# Patient Record
Sex: Male | Born: 1985 | Race: White | Hispanic: No | Marital: Married | Smoking: Current every day smoker
Health system: Southern US, Community
[De-identification: ages and names within clinical notes are randomized; demographics above are authoritative.]

## PROBLEM LIST (undated history)

## (undated) DIAGNOSIS — M791 Myalgia, unspecified site: Secondary | ICD-10-CM

## (undated) HISTORY — DX: Myalgia, unspecified site: M79.10

---

## 2016-10-21 HISTORY — PX: HERNIA REPAIR: SHX51

## 2017-11-07 ENCOUNTER — Other Ambulatory Visit: Payer: Self-pay | Admitting: Surgery

## 2017-11-07 DIAGNOSIS — Z8719 Personal history of other diseases of the digestive system: Secondary | ICD-10-CM

## 2017-11-07 DIAGNOSIS — Z9889 Other specified postprocedural states: Principal | ICD-10-CM

## 2017-11-13 ENCOUNTER — Ambulatory Visit
Admission: RE | Admit: 2017-11-13 | Discharge: 2017-11-13 | Disposition: A | Discharge status: Home / Self Care | Payer: No Typology Code available for payment source | Source: Ambulatory Visit | Attending: Surgery | Admitting: Surgery

## 2017-11-13 DIAGNOSIS — Z8719 Personal history of other diseases of the digestive system: Secondary | ICD-10-CM

## 2017-11-13 DIAGNOSIS — Z9889 Other specified postprocedural states: Principal | ICD-10-CM

## 2017-11-13 MED ORDER — IOPAMIDOL (ISOVUE-300) INJECTION 61%
100.0000 mL | Freq: Once | INTRAVENOUS | Status: AC | PRN
  Administered 2017-11-13: 100 mL via INTRAVENOUS

## 2018-03-05 ENCOUNTER — Other Ambulatory Visit: Payer: Self-pay | Admitting: Nurse Practitioner

## 2018-03-05 DIAGNOSIS — M545 Low back pain: Secondary | ICD-10-CM

## 2018-03-12 ENCOUNTER — Other Ambulatory Visit: Payer: No Typology Code available for payment source

## 2018-03-15 ENCOUNTER — Other Ambulatory Visit: Payer: No Typology Code available for payment source

## 2018-03-18 ENCOUNTER — Ambulatory Visit
Admission: RE | Admit: 2018-03-18 | Discharge: 2018-03-18 | Disposition: A | Discharge status: Home / Self Care | Payer: No Typology Code available for payment source | Source: Ambulatory Visit | Attending: Nurse Practitioner | Admitting: Nurse Practitioner

## 2018-03-18 DIAGNOSIS — M545 Low back pain: Secondary | ICD-10-CM

## 2018-05-11 ENCOUNTER — Ambulatory Visit: Payer: BLUE CROSS/BLUE SHIELD | Admitting: Family Medicine

## 2018-05-11 ENCOUNTER — Other Ambulatory Visit: Payer: Self-pay

## 2018-05-11 ENCOUNTER — Encounter: Payer: Self-pay | Admitting: Family Medicine

## 2018-05-11 VITALS — BP 122/88 | HR 62 | Temp 98.6°F | Ht 75.0 in | Wt 234.6 lb

## 2018-05-11 DIAGNOSIS — G894 Chronic pain syndrome: Secondary | ICD-10-CM | POA: Diagnosis not present

## 2018-05-11 NOTE — Assessment & Plan Note (Signed)
Unclear etiology of chronic pain Given distribution of pain however I don't think that pain is related to hernia repair and timing is coincidental.  ?Inflammatory component given response to prednisone.  ?IBD with GI symptoms as well Records requested from previous PCP to see what lab work up has been completed thus far. If not done already I would recommend ESR, CBC, metabolic panel,  CRP, ANA, RF, and possibly lyme titers.

## 2018-05-11 NOTE — Patient Instructions (Signed)
It was nice to see you today Stop at checkout and complete a records release form Once I get your records I'll call you with recommendations. Its ok to continue the prednisone for now.

## 2018-05-11 NOTE — Progress Notes (Signed)
Garrett Cunningham - 32 y.o. male MRN [id number]  Date of birth: 07/09/1986  Subjective Chief Complaint  Patient presents with  . Establish Care    had hernia sx last year and something hasn't been right since. Can't get any answers.    HPI Garrett Cunningham is a 32 y.o.  male here today to establish care with new pcp and has complaint of chronic pain.  Reports having open repair of L inguinal hernia through [facility] last year.  Surgery went well and he followed post operative instructions.  Had some prolonged groin pain on the L side thought to be related to surgery however a couple months afterwards began having pain that was radiating to top of ilium and into his back area.  He had a CT scan of the abdomen ordered by his surgeon in 10/2017 that showed scarring related to hernia repair but was otherwise unremarkable.  His pain continued to progress with radiation into the mid back, neck and rib area.  This has also been accompanied by feeling of stiffness, described as feeling "like I just worked out."  He also reports recurrent GI symptoms with abdominal cramping and diarrhea.  He recently tried nexium for this thinking the abdominal pain may be heartburn related but this has not really helped.   He tells me that his previous PCP had completed blood work on him to look for "something GI related and arthritis" but isn't really sure what was checked. He was also referred to pain management and MRI was ordered, showing a small disc protrusion at L5-S1 and mild facet arthropathy.  No stenosis was noted on this MRI.     In regards to his family history his mother has a history of SLE and sister has RSD/CRPS related to a prior injury.  He has tried several medications including meloxicam, baclofen, gabapentin and prednisone.  Prednisone has worked well for him and last time he took this he reports that he felt "almost back to normal" however symptoms returned shortly after completion.  He has not had improvement  with the other mentioned medications.  He denies fever, chills, joint swelling, blood in his stool, nausea or vomiting, or rash.   ROS:  A comprehensive ROS was completed and negative except as noted per HPI  No Known Allergies  History reviewed. No pertinent past medical history.  Past Surgical History:  Procedure Laterality Date  . HERNIA REPAIR Left 2018    Social History   Socioeconomic History  . Marital status: Married    Spouse name: Jami  . Number of children: 1  . Years of education: Not on file  . Highest education level: 12th grade  Occupational History  . Occupation: Regional Manager    Comment: [employer]  Social Needs  . Financial resource strain: Not on file  . Food insecurity:    Worry: Not on file    Inability: Not on file  . Transportation needs:    Medical: Not on file    Non-medical: Not on file  Tobacco Use  . Smoking status: Current Every Day Smoker    Types: Cigarettes  . Smokeless tobacco: Never Used  Substance and Sexual Activity  . Alcohol use: Yes  . Drug use: Not Currently  . Sexual activity: Yes  Lifestyle  . Physical activity:    Days per week: Not on file    Minutes per session: Not on file  . Stress: Not on file  Relationships  . Social connections:      Talks on phone: Not on file    Gets together: Not on file    Attends religious service: Not on file    Active member of club or organization: Not on file    Attends meetings of clubs or organizations: Not on file    Relationship status: Not on file  Other Topics Concern  . Not on file  Social History Narrative  . Not on file    Family History  Problem Relation Age of Onset  . Lupus Mother   . Other Sister        CRPS    Health Maintenance  Topic Date Due  . HIV Screening  01/01/2001  . TETANUS/TDAP  01/01/2005  . INFLUENZA VACCINE  05/21/2018     ----------------------------------------------------------------------------------------------------------------------------------------------------------------------------------------------------------------- Physical Exam BP 122/88 (BP Location: Left Arm, Patient Position: Sitting, Cuff Size: Normal)   Pulse 62   Temp 98.6 F (37 C) (Oral)   Ht 6' 3" (1.905 m)   Wt 234 lb 9.6 oz (106.4 kg)   SpO2 99%   BMI 29.32 kg/m   Physical Exam  Constitutional: He is oriented to person, place, and time. He appears well-nourished. No distress.  HENT:  Head: Normocephalic and atraumatic.  Mouth/Throat: Oropharynx is clear and moist.  Eyes: No scleral icterus.  Neck: Neck supple. No thyromegaly present.  Cardiovascular: Normal rate, regular rhythm and normal heart sounds.  Pulmonary/Chest: Effort normal and breath sounds normal. He exhibits tenderness (TTP along lower costochondral junction and lateral ribs bilaterally.  L side of chest slightly more prominent anteriorly compared to R).  Abdominal: Soft. Bowel sounds are normal. He exhibits no distension. There is no tenderness. There is no guarding.  Musculoskeletal: He exhibits tenderness (No joint swelling noted. Mild ttp along upper shoulder and neck.  FROM throughout without any perceivable weakness. ). He exhibits no edema.  Lymphadenopathy:    He has no cervical adenopathy.  Neurological: He is alert and oriented to person, place, and time. No cranial nerve deficit. Coordination normal.  Skin: Skin is warm and dry. No rash noted.  Psychiatric: He has a normal mood and affect. His behavior is normal. Thought content normal.    ------------------------------------------------------------------------------------------------------------------------------------------------------------------------------------------------------------------- Assessment and Plan  Chronic pain syndrome Unclear etiology of chronic pain Given distribution of  pain however I don't think that pain is related to hernia repair and timing is coincidental.  ?Inflammatory component given response to prednisone.  ?IBD with GI symptoms as well Records requested from previous PCP to see what lab work up has been completed thus far. If not done already I would recommend ESR, CBC, metabolic panel,  CRP, ANA, RF, and possibly lyme titers.

## 2018-05-20 ENCOUNTER — Telehealth: Payer: Self-pay

## 2018-05-20 NOTE — Telephone Encounter (Signed)
Spoke to pt and informed to records had been received.

## 2018-05-20 NOTE — Telephone Encounter (Signed)
Copied from CRM [id number]. Topic: General - Other >> May 20, 2018  9:00 AM Percival SpanishKennedy, Cheryl W wrote:  Pt call to ask if his medical records were [facility].  >> May 20, 2018  9:29 AM Noitamyae, Phetcharat, LPN wrote: Please advise.

## 2018-05-28 NOTE — Telephone Encounter (Signed)
Pt is aware we got the record.   Pt stated Dr. Ashley RoyaltyMatthews was suppose to call him back what is the next step after reviewed records.

## 2018-05-28 NOTE — Telephone Encounter (Signed)
Patient calling back to see if his records were received from [facility]?

## 2018-06-03 NOTE — Telephone Encounter (Signed)
Pt has appt on 8.15.19 with Dr Ashley RoyaltyMatthews and will discuss at this time

## 2018-06-04 ENCOUNTER — Ambulatory Visit: Payer: BLUE CROSS/BLUE SHIELD | Admitting: Family Medicine

## 2018-06-04 ENCOUNTER — Telehealth: Payer: Self-pay | Admitting: Family Medicine

## 2018-06-04 ENCOUNTER — Encounter: Payer: Self-pay | Admitting: Family Medicine

## 2018-06-04 VITALS — BP 122/82 | HR 89 | Temp 98.6°F | Ht 75.0 in | Wt 228.0 lb

## 2018-06-04 DIAGNOSIS — R1013 Epigastric pain: Secondary | ICD-10-CM | POA: Diagnosis not present

## 2018-06-04 DIAGNOSIS — M791 Myalgia, unspecified site: Secondary | ICD-10-CM | POA: Diagnosis not present

## 2018-06-04 DIAGNOSIS — G894 Chronic pain syndrome: Secondary | ICD-10-CM | POA: Diagnosis not present

## 2018-06-04 DIAGNOSIS — M2550 Pain in unspecified joint: Secondary | ICD-10-CM

## 2018-06-04 MED ORDER — PANTOPRAZOLE SODIUM 40 MG PO TBEC: 40.0000 mg | DELAYED_RELEASE_TABLET | Freq: Every day | ORAL | 3 refills | Status: DC

## 2018-06-04 MED ORDER — SUCRALFATE 1 G PO TABS: 1.0000 g | ORAL_TABLET | Freq: Three times a day (TID) | ORAL | 1 refills | Status: DC

## 2018-06-04 MED ORDER — LIDOCAINE 5 % EX PTCH: 1.0000 | MEDICATED_PATCH | CUTANEOUS | 0 refills | Status: DC

## 2018-06-04 NOTE — Telephone Encounter (Signed)
Please check.

## 2018-06-04 NOTE — Patient Instructions (Signed)
Continue nexium, however I would recommend taking twice per day. Start taking carafate as directed  We'll call you with lab results

## 2018-06-04 NOTE — Progress Notes (Signed)
Garrett Cunningham - 32 y.o. male MRN [id number]  Date of birth: [birth year]  Subjective Chief Complaint  Patient presents with  . Abdominal Pain    stomach constanly hurts, worse in eveneing and after meals. Tried to cut out junk food and drinking more water and trying probiotics.    HPI Garrett Cunningham is a 32 y.o. male here today for follow up of abdominal pain and arthralgia/myalgias.  He continues to have diffuse pain as well as pain in his abdomen.  Has also has some nerve pain from previous hernia surgery not relieved with gabapentin previously.   Pain in abdomen is typically worse after eating and worse in the evening.  He is only eating one meal per day because of this.  He has had inflammatory markers including ESR, CRP, RF, and CK, Aldolase, and ANA checked which were normal. He also had a normal abdominal US.  He reports associated diarrhea and occasional nausea.  He was taking nexium however has been off this for the past couple of weeks because he was out of it.  He denies melena, fever, chills or rash.   ROS:  A comprehensive ROS was completed and negative except as noted per HPI  No Known Allergies  No past medical history on file.  Past Surgical History:  Procedure Laterality Date  . HERNIA REPAIR Left 2018    Social History   Socioeconomic History  . Marital status: Married    Spouse name: Wende Crease  . Number of children: 1  . Years of education: Not on file  . Highest education level: 12th grade  Occupational History  . Occupation: English as a second language teacher    Comment: [employer]  . Financial resource strain: Not on file  . Food insecurity:    Worry: Not on file    Inability: Not on file  . Transportation needs:    Medical: Not on file    Non-medical: Not on file  Tobacco Use  . Smoking status: Current Every Day Smoker    Types: Cigarettes  . Smokeless tobacco: Never Used  Substance and Sexual Activity  . Alcohol use: Yes  . Drug use: Not Currently  .  Sexual activity: Yes  Lifestyle  . Physical activity:    Days per week: Not on file    Minutes per session: Not on file  . Stress: Not on file  Relationships  . Social connections:    Talks on phone: Not on file    Gets together: Not on file    Attends religious service: Not on file    Active member of club or organization: Not on file    Attends meetings of clubs or organizations: Not on file    Relationship status: Not on file  Other Topics Concern  . Not on file  Social History Narrative  . Not on file    Family History  Problem Relation Age of Onset  . Lupus Mother   . Other Sister        CRPS    Health Maintenance  Topic Date Due  . HIV Screening  01/01/2001  . TETANUS/TDAP  01/01/2005  . INFLUENZA VACCINE  05/21/2018    ----------------------------------------------------------------------------------------------------------------------------------------------------------------------------------------------------------------- Physical Exam BP 122/82 (BP Location: Left Arm, Patient Position: Sitting, Cuff Size: Normal)   Pulse 89   Temp 98.6 F (37 C) (Oral)   Ht _0  (1.905 m)   Wt 228 lb (103.4 kg)   SpO2 97%   BMI 28.50 kg/m  Physical Exam  Constitutional: He is oriented to person, place, and time. He appears well-nourished. No distress.  HENT:  Head: Normocephalic.  Mouth/Throat: Oropharynx is clear and moist.  Eyes: No scleral icterus.  Neck: Neck supple. No thyromegaly present.  Cardiovascular: Normal rate, regular rhythm and normal heart sounds.  Pulmonary/Chest: Effort normal and breath sounds normal.  Abdominal: Soft. Normal appearance and bowel sounds are normal. He exhibits no distension and no mass. There is tenderness (TTP along epigastric and periumbilical areas). There is no guarding.  Musculoskeletal: He exhibits no tenderness.  Lymphadenopathy:    He has no cervical adenopathy.  Neurological: He is alert and oriented to person, place,  and time.  Skin: Skin is warm and dry. No rash noted.  Psychiatric: He has a normal mood and affect. His behavior is normal.    ------------------------------------------------------------------------------------------------------------------------------------------------------------------------------------------------------------------- Assessment and Plan  Chronic pain syndrome Rx for lidoderm patch to area most bothersome at site of previous surgery..  Has tried several other medication without relief.  Check lyme titers  Epigastric pain Rx for carafate and protonix Check celiac panel If not improving will refer to GI.

## 2018-06-04 NOTE — Telephone Encounter (Signed)
Copied from CRM [phone]. Topic: Quick Communication - See Telephone Encounter >> Jun 04, 2018  2:20 PM Lorrine KinMcGee, [place] Chagnon B, [place] wrote: CRM for notification. See Telephone encounter for: 06/04/18. Patient calling and states that he was just seen in the office and was told that I prescription for some pain patches would be sent to the pharmacy. Patient at the pharmacy now and they are telling him they don't have the prescription. Please advise. [id number] - [place], [place] - [address]

## 2018-06-04 NOTE — Assessment & Plan Note (Signed)
Rx for carafate and protonix Check celiac panel If not improving will refer to GI.

## 2018-06-04 NOTE — Assessment & Plan Note (Signed)
Rx for lidoderm patch to area most bothersome at site of previous surgery..  Has tried several other medication without relief.  Check lyme titers

## 2018-06-08 LAB — LYME AB/WESTERN BLOT REFLEX
LYME DISEASE AB, QUANT, IGM: 0.91 index — ABNORMAL HIGH (ref 0.00–0.79)
LYME IGG/IGM AB: 2.67 {ISR} — AB (ref 0.00–0.90)

## 2018-06-08 LAB — LYME, WESTERN BLOT, SERUM (REFLEXED)
IGG P28 AB.: ABSENT
IGG P45 AB.: ABSENT
IGM P23 AB.: ABSENT
IgG P23 Ab.: ABSENT
IgG P30 Ab.: ABSENT
IgG P66 Ab.: ABSENT
IgG P93 Ab.: ABSENT
IgM P39 Ab.: ABSENT
IgM P41 Ab.: ABSENT
Lyme IgG Wb: NEGATIVE
Lyme IgM Wb: NEGATIVE

## 2018-06-08 LAB — GLIADIN ANTIBODIES, SERUM
GLIADIN IGG: 2 U
Gliadin IgA: 4 Units

## 2018-06-08 LAB — TISSUE TRANSGLUTAMINASE, IGA: (tTG) Ab, IgA: 1 U/mL

## 2018-06-08 LAB — RETICULIN ANTIBODIES, IGA W TITER: Reticulin IGA Screen: NEGATIVE

## 2018-06-10 ENCOUNTER — Other Ambulatory Visit: Payer: Self-pay | Admitting: Family Medicine

## 2018-06-10 DIAGNOSIS — M2550 Pain in unspecified joint: Secondary | ICD-10-CM

## 2018-06-10 DIAGNOSIS — R1013 Epigastric pain: Secondary | ICD-10-CM

## 2018-06-10 DIAGNOSIS — M791 Myalgia, unspecified site: Secondary | ICD-10-CM

## 2018-06-10 NOTE — Progress Notes (Signed)
-  Lyme antibodies were positive however confirmation testing is negative.  This likely represents a false positive antibody test .  I would like to re-check this in about 8 weeks to see if this changes at all. -Celiac tests are negative -Will place referral to GI as we discussed.

## 2018-06-12 ENCOUNTER — Ambulatory Visit: Payer: BLUE CROSS/BLUE SHIELD | Admitting: Gastroenterology

## 2018-06-12 ENCOUNTER — Encounter: Payer: Self-pay | Admitting: Gastroenterology

## 2018-06-12 VITALS — BP 110/72 | HR 74 | Ht 75.0 in | Wt 233.0 lb

## 2018-06-12 DIAGNOSIS — R1010 Upper abdominal pain, unspecified: Secondary | ICD-10-CM | POA: Diagnosis not present

## 2018-06-12 NOTE — Patient Instructions (Addendum)
If you are age 32 or older, your body mass index should be between 23-30. Your Body mass index is 29.12 kg/m. If this is out of the aforementioned range listed, please consider follow up with your Primary Care Provider.  If you are age 32 or younger, your body mass index should be between 19-25. Your Body mass index is 29.12 kg/m. If this is out of the aformentioned range listed, please consider follow up with your Primary Care Provider.   You have been scheduled for an endoscopy. Please follow written instructions given to you at your visit today. If you use inhalers (even only as needed), please bring them with you on the day of your procedure. Your physician has requested that you go to www.startemmi.com and enter the access code given to you at your visit today. This web site gives a general overview about your procedure. However, you should still follow specific instructions given to you by our office regarding your preparation for the procedure.  It was a pleasure to see you today!  Dr. Myrtie Neitheranis

## 2018-06-12 NOTE — Progress Notes (Signed)
[facility] Consult Note:  History: Garrett Cunningham 06/12/2018  Referring physician: Everrett Coombe, DO  Reason for consult/chief complaint: epigastirc pain (constant and gets worse at night, onset in Jan 2019, has had problems since inguenl hernia surgery 04/2017)   Subjective  HPI:  This is a pleasant 32 year old man referred by Dr. Ashley Royalty of primary care for abdominal pain.  He had a left inguinal hernia repair in 2018, and since then has had a constellation of pain symptoms that have not yet had a clear explanation.  He saw the surgeon earlier this year for persistent left inguinal pain radiating to the flank.  CT scan of the pelvis was unrevealing as described below.  He has had back pain, pain over both lower anterior rib cage is that is sometimes more swollen and tender.  There is a family history of rheumatologic condition, so he has apparently had an extensive work-up for various inflammatory markers, all of which has been negative.  He describes upper abdominal pain that is dull and sometimes worse with meals, but like all of his pain, worsens late in the day.  He has a generalized periumbilical dull discomfort that is nonradiating.  He was having some intermittent loose stool but that stopped at least a month ago.  He got no improvement from a PPI, there is been modest improvement so far on sucralfate 4 times daily.  His previous primary care physician reportedly obtain an abdominal ultrasound which was normal according to his most recent primary care note by Dr. Ashley Royalty.  He also tells me a pain clinic evaluation was "useless" because they had misplaced the results of some blood work and did not provide him any relief. JB denies nausea, vomiting, early satiety or weight loss.  His appetite is good and he denies rectal bleeding.   ROS:  Review of Systems  Constitutional: Negative for appetite change and unexpected weight change.  HENT: Negative for mouth  sores and voice change.   Eyes: Negative for pain and redness.  Respiratory: Negative for cough and shortness of breath.   Cardiovascular: Negative for chest pain and palpitations.  Genitourinary: Negative for dysuria and hematuria.  Musculoskeletal: Positive for back pain. Negative for arthralgias and myalgias.  Skin: Negative for pallor and rash.  Neurological: Negative for weakness and headaches.  Hematological: Negative for adenopathy.     Past Medical History: Past Medical History:  Diagnosis Date  . Myalgia      Past Surgical History: Past Surgical History:  Procedure Laterality Date  . HERNIA REPAIR Left 2018     Family History: Family History  Problem Relation Age of Onset  . Lupus Mother   . Other Sister        RSD  . Other Father        hx unknown   . Colon cancer Neg Hx   . Esophageal cancer Neg Hx   . Rectal cancer Neg Hx     Social History: Social History   Socioeconomic History  . Marital status: Married    Spouse name: Clearnce Cunningham  . Number of children: 1  . Years of education: Not on file  . Highest education level: 12th grade  Occupational History  . Occupation: Civil Service fast streamer    Comment: [employer]  Social Needs  . Financial resource strain: Not on file  . Food insecurity:    Worry: Not on file    Inability: Not on file  . Transportation needs:    Medical: Not  on file    Non-medical: Not on file  Tobacco Use  . Smoking status: Current Every Day Smoker    Years: 13.00    Types: Cigarettes  . Smokeless tobacco: Never Used  Substance and Sexual Activity  . Alcohol use: Not Currently    Comment: rarely  . Drug use: Yes    Types: Marijuana    Comment: daily  . Sexual activity: Yes  Lifestyle  . Physical activity:    Days per week: Not on file    Minutes per session: Not on file  . Stress: Not on file  Relationships  . Social connections:    Talks on phone: Not on file    Gets together: Not on file    Attends religious service: Not on  file    Active member of club or organization: Not on file    Attends meetings of clubs or organizations: Not on file    Relationship status: Not on file  Other Topics Concern  . Not on file  Social History Narrative  . Not on file    Allergies: No Known Allergies  Outpatient Meds: Current Outpatient Medications  Medication Sig Dispense Refill  . MULTIPLE VITAMIN PO Take by mouth. 2 gummies daily    . pantoprazole (PROTONIX) 40 MG tablet Take 40 mg by mouth daily. Takes at supper time    . Probiotic Product (PROBIOTIC DAILY PO) Take 2 capsules by mouth 2 (two) times daily.    . sucralfate (CARAFATE) 1 g tablet Take 1 tablet (1 g total) by mouth 4 (four) times daily -  with meals and at bedtime. 120 tablet 1   No current facility-administered medications for this visit.       ___________________________________________________________________ Objective   Exam:  BP 110/72   Pulse 74   Ht 6\' 3"  (1.905 m)   Wt 233 lb (105.7 kg)   BMI 29.12 kg/m    General: this is a(n) well-appearing man  Eyes: sclera anicteric, no redness  ENT: oral mucosa moist without lesions, no cervical or supraclavicular lymphadenopathy, good dentition  CV: RRR without murmur, S1/S2, no JVD, no peripheral edema  Resp: clear to auscultation bilaterally, normal RR and effort noted  GI: soft, upper abdominal midline abdominal wall tenderness, with active bowel sounds. No guarding or palpable organomegaly noted.  Skin; warm and dry, no rash or jaundice noted  Neuro: awake, alert and oriented x 3. Normal gross motor function and fluent speech He has mild soft tissue edema of the anterior left lower chest wall, nontender, normal overlying skin.  It is not present on the right.  He says sometimes this area becomes more prominent and tender. Labs:  Neg TTG IgA Ab (no IgA level)  Lyme test that PCP thinks is false positive  Radiologic Studies:  CT pelvis Jan 2019 with scarring left inguinal  site  Assessment: Encounter Diagnosis  Name Primary?  Marland Kitchen. Upper abdominal pain Yes    This is difficult to characterize, and also not certain if it is a primary digestive condition or part of some other, as yet undefined, systemic inflammatory or pain syndrome.  Plan:  Upper endoscopy.  He is agreeable after discussion of procedure and risks.  The benefits and risks of the planned procedure were described in detail with the patient or (when appropriate) their health care proxy.  Risks were outlined as including, but not limited to, bleeding, infection, perforation, adverse medication reaction leading to cardiac or pulmonary decompensation, or pancreatitis (if ERCP).  The limitation of incomplete mucosal visualization was also discussed.  No guarantees or warranties were given.   Thank you for the courtesy of this consult.  Please call me with any questions or concerns.  Charlie Pitter III  CC: Everrett Coombe, DO

## 2018-06-24 ENCOUNTER — Encounter: Payer: Self-pay | Admitting: Family Medicine

## 2018-06-24 ENCOUNTER — Ambulatory Visit: Payer: BLUE CROSS/BLUE SHIELD | Admitting: Family Medicine

## 2018-06-24 VITALS — BP 122/80 | HR 70 | Temp 97.8°F | Ht 75.0 in | Wt 233.0 lb

## 2018-06-24 DIAGNOSIS — R1013 Epigastric pain: Secondary | ICD-10-CM | POA: Diagnosis not present

## 2018-06-24 DIAGNOSIS — H66002 Acute suppurative otitis media without spontaneous rupture of ear drum, left ear: Secondary | ICD-10-CM

## 2018-06-24 DIAGNOSIS — M791 Myalgia, unspecified site: Secondary | ICD-10-CM

## 2018-06-24 DIAGNOSIS — M2550 Pain in unspecified joint: Secondary | ICD-10-CM | POA: Diagnosis not present

## 2018-06-24 MED ORDER — AMOXICILLIN-POT CLAVULANATE 875-125 MG PO TABS: 1.0000 | ORAL_TABLET | Freq: Two times a day (BID) | ORAL | 0 refills | Status: DC

## 2018-06-24 NOTE — Assessment & Plan Note (Signed)
Rx for augmentin Increase fluid intake May use tylenol prn for pain control

## 2018-06-24 NOTE — Patient Instructions (Signed)

## 2018-06-24 NOTE — Progress Notes (Signed)
Garrett Cunningham - 32 y.o. male MRN [id number]  Date of birth: 06-12-1986  Subjective Chief Complaint  Patient presents with  . Ear Pain    HPI Garrett Cunningham is a 32 y.o. male here today with complaint of L sided ear pain.  Pain began a few days ago.  Painful to lay on this side a times.  Feels that pain radiates just below ear and into neck area.  Denies drainage from ear, congestion, headache, vision change, fever, chills.  He has not tried anything for treatment.   ROS:  A comprehensive ROS was completed and negative except as noted per HPI  No Known Allergies  Past Medical History:  Diagnosis Date  . Myalgia     Past Surgical History:  Procedure Laterality Date  . HERNIA REPAIR Left 2018    Social History   Socioeconomic History  . Marital status: Married    Spouse name: [name]  . Number of children: 1  . Years of education: Not on file  . Highest education level: 12th grade  Occupational History  . Occupation: Civil Service [employer]    Comment: [employer]  Social Needs  . Financial resource strain: Not on file  . Food insecurity:    Worry: Not on file    Inability: Not on file  . Transportation needs:    Medical: Not on file    Non-medical: Not on file  Tobacco Use  . Smoking status: Current Every Day Smoker    Years: 13.00    Types: Cigarettes  . Smokeless tobacco: Never Used  Substance and Sexual Activity  . Alcohol use: Not Currently    Comment: rarely  . Drug use: Yes    Types: Marijuana    Comment: daily  . Sexual activity: Yes  Lifestyle  . Physical activity:    Days per week: Not on file    Minutes per session: Not on file  . Stress: Not on file  Relationships  . Social connections:    Talks on phone: Not on file    Gets together: Not on file    Attends religious service: Not on file    Active member of club or organization: Not on file    Attends meetings of clubs or organizations: Not on file    Relationship status: Not on file  Other Topics  Concern  . Not on file  Social History Narrative  . Not on file    Family History  Problem Relation Age of Onset  . Lupus Mother   . Other Sister        RSD  . Other Father        hx unknown   . Colon cancer Neg Hx   . Esophageal cancer Neg Hx   . Rectal cancer Neg Hx     Health Maintenance  Topic Date Due  . HIV Screening  01/01/2001  . TETANUS/TDAP  01/01/2005  . INFLUENZA VACCINE  05/21/2018    ----------------------------------------------------------------------------------------------------------------------------------------------------------------------------------------------------------------- Physical Exam BP 122/80   Pulse 70   Temp 97.8 F (36.6 C)   Ht 6\' 3"  (1.905 m)   Wt 233 lb (105.7 kg)   SpO2 98%   BMI 29.12 kg/m   Physical Exam  Constitutional: He is oriented to person, place, and time. He appears well-nourished. No distress.  HENT:  Head: Normocephalic and atraumatic.  Right Ear: External ear normal.  Left Ear: External ear normal.  Mouth/Throat: Oropharynx is clear and moist.  TM is inflamed and erythematous  on L R TM is normal   Eyes: No scleral icterus.  Neck: Normal range of motion. Neck supple. No thyromegaly present.  Cardiovascular: Normal rate, regular rhythm and normal heart sounds.  Pulmonary/Chest: Effort normal.  Neurological: He is alert and oriented to person, place, and time.  Skin: Skin is warm and dry. No rash noted.  Psychiatric: He has a normal mood and affect. His behavior is normal.    ------------------------------------------------------------------------------------------------------------------------------------------------------------------------------------------------------------------- Assessment and Plan  Non-recurrent acute suppurative otitis media of left ear without spontaneous rupture of tympanic membrane Rx for augmentin Increase fluid intake May use tylenol prn for pain control

## 2018-06-26 ENCOUNTER — Other Ambulatory Visit: Payer: Self-pay | Admitting: Family Medicine

## 2018-06-26 DIAGNOSIS — R768 Other specified abnormal immunological findings in serum: Secondary | ICD-10-CM

## 2018-06-26 LAB — LYME, WESTERN BLOT, SERUM (REFLEXED)
IGG P30 AB.: ABSENT
IGG P66 AB.: ABSENT
IGM P23 AB.: ABSENT
IGM P39 AB.: ABSENT
IgG P45 Ab.: ABSENT
IgG P93 Ab.: ABSENT
IgM P41 Ab.: ABSENT
Lyme IgG Wb: POSITIVE — AB
Lyme IgM Wb: NEGATIVE

## 2018-06-26 LAB — LYME AB/WESTERN BLOT REFLEX
LYME DISEASE AB, QUANT, IGM: 0.9 index — ABNORMAL HIGH (ref 0.00–0.79)
Lyme IgG/IgM Ab: 2.3 {ISR} — ABNORMAL HIGH (ref 0.00–0.90)

## 2018-06-26 NOTE — Progress Notes (Signed)
Please let him know that repeat testing returned positive for Lyme.  I have placed a referral to infectious disease for this.

## 2018-06-30 ENCOUNTER — Telehealth: Payer: Self-pay | Admitting: Gastroenterology

## 2018-06-30 NOTE — Telephone Encounter (Signed)
We can proceed with EGD as scheduled. Thanks for checking.  - HD

## 2018-06-30 NOTE — Telephone Encounter (Signed)
Patient is scheduled for an EGD on 9/23, currently he is on Augmentin for an ear infection, he will complete this course of treatment on 9/14. He has also been diagnosed with Lyme disease and referred to Infectious Disease. Patient was not given any further medication at this time. Patient wants to know if okay to proceed with planned EGD. Thank you.

## 2018-06-30 NOTE — Telephone Encounter (Signed)
Patient advised to proceed with EGD.

## 2018-07-02 ENCOUNTER — Encounter: Payer: Self-pay | Admitting: Gastroenterology

## 2018-07-03 ENCOUNTER — Telehealth: Payer: Self-pay | Admitting: Family Medicine

## 2018-07-03 NOTE — Telephone Encounter (Signed)
Copied from CRM [id number]. Topic: Quick Communication - See Telephone Encounter >> Jul 03, 2018  4:21 PM Lorrine KinMcGee, Lannette Avellino B, [place] wrote: CRM for notification. See Telephone encounter for: 07/03/18. Patient calling and states that he has not heard from infectious disease yet. Per referral note, they had called the patient today- patient denies getting a call or voice mail from them. Would like to know what the next step is or to know what a contact number for them would be?  Also, states that he has come down with a cold and would like to know if there are any OTC cold medications that he should avoid taking with the medications he was prescribed at his last visit. Please advise.

## 2018-07-06 NOTE — Telephone Encounter (Signed)
Spoke with patient regarding referral. Patient states that Infectious diease did not reach out to him.[facility] the [facility]. Patient states that he will give them a call. Also asked patient about not feeling well, patient states that he is feeling better. Advised patient that if he is still having problems reaching Infectious diease to give the office a call back.

## 2018-07-10 ENCOUNTER — Telehealth: Payer: Self-pay | Admitting: Gastroenterology

## 2018-07-13 ENCOUNTER — Encounter: Payer: Self-pay | Admitting: Gastroenterology

## 2018-07-13 ENCOUNTER — Ambulatory Visit (AMBULATORY_SURGERY_CENTER): Payer: BLUE CROSS/BLUE SHIELD | Admitting: Gastroenterology

## 2018-07-13 VITALS — BP 106/64 | HR 65 | Temp 98.9°F | Resp 23 | Ht 75.0 in | Wt 233.0 lb

## 2018-07-13 DIAGNOSIS — R1010 Upper abdominal pain, unspecified: Secondary | ICD-10-CM

## 2018-07-13 DIAGNOSIS — K2970 Gastritis, unspecified, without bleeding: Secondary | ICD-10-CM

## 2018-07-13 DIAGNOSIS — K2950 Unspecified chronic gastritis without bleeding: Secondary | ICD-10-CM | POA: Diagnosis not present

## 2018-07-13 MED ORDER — SODIUM CHLORIDE 0.9 % IV SOLN: 500.0000 mL | Freq: Once | INTRAVENOUS | Status: DC

## 2018-07-13 NOTE — Patient Instructions (Signed)
Handout given on Gastritis  YOU HAD AN ENDOSCOPIC PROCEDURE TODAY: Refer to the procedure report and other information in the discharge instructions given to you for any specific questions about what was found during the examination. If this information does not answer your questions, please call [place] office at [phone] to clarify.   YOU SHOULD EXPECT: Some feelings of bloating in the abdomen. Passage of more gas than usual. Walking can help get rid of the air that was put into your GI tract during the procedure and reduce the bloating. If you had a lower endoscopy (such as a colonoscopy or flexible sigmoidoscopy) you may notice spotting of blood in your stool or on the toilet paper. Some abdominal soreness may be present for a day or two, also.  DIET: Your first meal following the procedure should be a light meal and then it is ok to progress to your normal diet. A half-sandwich or bowl of soup is an example of a good first meal. Heavy or fried foods are harder to digest and may make you feel nauseous or bloated. Drink plenty of fluids but you should avoid alcoholic beverages for 24 hours. If you had a esophageal dilation, please see attached instructions for diet.    ACTIVITY: Your care partner should take you home directly after the procedure. You should plan to take it easy, moving slowly for the rest of the day. You can resume normal activity the day after the procedure however YOU SHOULD NOT DRIVE, use power tools, machinery or perform tasks that involve climbing or major physical exertion for 24 hours (because of the sedation medicines used during the test).   SYMPTOMS TO REPORT IMMEDIATELY: A gastroenterologist can be reached at any hour. Please call [phone]  for any of the following symptoms:   Following upper endoscopy (EGD, EUS, ERCP, esophageal dilation) Vomiting of blood or coffee ground material  New, significant abdominal pain  New, significant chest pain or pain under the  shoulder blades  Painful or persistently difficult swallowing  New shortness of breath  Black, tarry-looking or red, bloody stools  FOLLOW UP:  If any biopsies were taken you will be contacted by phone or by letter within the next 1-3 weeks. Call [phone]  if you have not heard about the biopsies in 3 weeks.  Please also call with any specific questions about appointments or follow up tests.  

## 2018-07-13 NOTE — Progress Notes (Signed)
A and O x3. Report to RN. Tolerated MAC anesthesia well.Teeth unchanged after procedure.

## 2018-07-13 NOTE — Progress Notes (Signed)
Pt reports using marijuana yesterday.He has recently found out that he tested positive for Lyme's Disease and goes to see his PCP to start treatment in the am. Sm

## 2018-07-13 NOTE — Op Note (Signed)
[facility] Patient Name: Garrett Cunningham Procedure Date: 07/13/2018 11:09 AM MRN: [id number] Endoscopist: Sherilyn Cooter L. Myrtie Neither , MD Age: 32 Referring MD:  Date of Birth: 06-04-1986 Gender: Male Account #: [id number] Procedure:                Upper GI endoscopy Indications:              Upper abdominal pain Medicines:                Monitored Anesthesia Care Procedure:                Pre-Anesthesia Assessment:                           - Prior to the procedure, a History and Physical                            was performed, and patient medications and                            allergies were reviewed. The patient's tolerance of                            previous anesthesia was also reviewed. The risks                            and benefits of the procedure and the sedation                            options and risks were discussed with the patient.                            All questions were answered, and informed consent                            was obtained. Prior Anticoagulants: The patient has                            taken no previous anticoagulant or antiplatelet                            agents. ASA Grade Assessment: II - A patient with                            mild systemic disease. After reviewing the risks                            and benefits, the patient was deemed in                            satisfactory condition to undergo the procedure.                           After obtaining informed consent, the endoscope was  passed under direct vision. Throughout the                            procedure, the patient's blood pressure, pulse, and                            oxygen saturations were monitored continuously. The                            Endoscope was introduced through the mouth, and                            advanced to the second part of duodenum. The upper                            GI endoscopy was accomplished  without difficulty.                            The patient tolerated the procedure well. Scope In: Scope Out: Findings:                 The esophagus was normal.                           Multiple diminutive erosions were found in the                            prepyloric region of the stomach. Biopsies were                            taken with a cold forceps for histology. ([place]                            protocol).                           The exam of the stomach was otherwise normal except                            for mild patchy edema in gastric body.                           The examined duodenum was normal. Complications:            No immediate complications. Estimated Blood Loss:     Estimated blood loss was minimal. Impression:               - Normal esophagus.                           - Erosive gastropathy. Biopsied.                           - Normal examined duodenum.                           If biopsies negative for  H. pylori, and considering                            musculoskeletal symptoms, the upper abdominal pain                            seems likely due the recently-diagnosed Lyme                            disease. Recommendation:           - Patient has a contact number available for                            emergencies. The signs and symptoms of potential                            delayed complications were discussed with the                            patient. Return to normal activities tomorrow.                            Written discharge instructions were provided to the                            patient.                           - Resume previous diet.                           - Continue present medications.                           - Await pathology results. Hanaan Gancarz L. Myrtie Neither, MD 07/13/2018 11:24:58 AM This report has been signed electronically.

## 2018-07-13 NOTE — Progress Notes (Signed)
Called to room to assist during endoscopic procedure.  Patient ID and intended procedure confirmed with present staff. Received instructions for my participation in the procedure from the performing physician.  

## 2018-07-14 ENCOUNTER — Encounter: Payer: Self-pay | Admitting: Internal Medicine

## 2018-07-14 ENCOUNTER — Ambulatory Visit (INDEPENDENT_AMBULATORY_CARE_PROVIDER_SITE_OTHER): Payer: BLUE CROSS/BLUE SHIELD | Admitting: Internal Medicine

## 2018-07-14 ENCOUNTER — Telehealth: Payer: Self-pay | Admitting: *Deleted

## 2018-07-14 VITALS — BP 128/85 | HR 61 | Temp 98.2°F | Wt 233.4 lb

## 2018-07-14 DIAGNOSIS — Z789 Other specified health status: Secondary | ICD-10-CM

## 2018-07-14 DIAGNOSIS — Z0184 Encounter for antibody response examination: Secondary | ICD-10-CM

## 2018-07-14 DIAGNOSIS — G44229 Chronic tension-type headache, not intractable: Secondary | ICD-10-CM | POA: Diagnosis not present

## 2018-07-14 DIAGNOSIS — G894 Chronic pain syndrome: Secondary | ICD-10-CM

## 2018-07-14 NOTE — Progress Notes (Signed)
Patient ID: Garrett Cunningham, male   DOB: [birth year], 32 y.o.   MRN: [id number]  HPI Garrett Cunningham is a 32yo M who is otherwise in good state of health up until, May 20 2017 had left sided inguinal hernia surgery. But started to have sequelae of left flank pain/sensitivity that has gradually over time--> jan 2019 moved up chest ,sensitivity to touch  - CT was done which was fine.  Was placed on neurontin without much improvement , and had exact same conversation again with his surgeon a month later where he lost trust in his surgeon. He states that he even has Had pain with ultrasound at touch of probe. Negative work up at that time, including mri spine.  Went to [place] [facility] for pain specialist -> got placed on meds? Unknown which one. Also reviewed her imaging. Had to get his own imaging and labs.  Sister has RSD, who recommended to her brother to see her friend PCP, dr Ashley Royalty.- who checked lyme serology which are IgG, thus referred to our clinic  In terms of pain it can be equisitely tender to touch but also feels present beneath ribs which radiates, Chest - feels like sore muscles Swelling of skin/abdominal wall Bilaterally swelling  Near his ribs parasternum  He is increasingly frustrated to trying to figure out why he is having this pain complex and feels it is still related to his generall surgery  He also comments that he has daily headaches, with neck pain tight muscles of the neck. No photophobia. Alleviated with tylenol.  The patient reports that he is  Originally from [place], [place] - down to [place] then moved to [place] x 3 y, then moved [place] x 52yrs, moved down to [place]   3.5 yrs.   No tickbite exposure that he remembers married 11 yrs.   I have reviewed his paperwork and work up done thus far   Outpatient Encounter Medications as of 07/14/2018  Medication Sig  . MULTIPLE VITAMIN PO Take by mouth. 2 gummies daily  . pantoprazole (PROTONIX) 40 MG  tablet Take 40 mg by mouth daily. Takes at supper time  . Probiotic Product (PROBIOTIC DAILY PO) Take 2 capsules by mouth 2 (two) times daily.  . sucralfate (CARAFATE) 1 g tablet Take 1 tablet (1 g total) by mouth 4 (four) times daily -  with meals and at bedtime.   No facility-administered encounter medications on file as of 07/14/2018.      Patient Active Problem List   Diagnosis Date Noted  . Non-recurrent acute suppurative otitis media of left ear without spontaneous rupture of tympanic membrane 06/24/2018  . Epigastric pain 06/04/2018  . Chronic pain syndrome 05/11/2018     Health Maintenance Due  Topic Date Due  . HIV Screening  01/01/2001  . TETANUS/TDAP  01/01/2005  . INFLUENZA VACCINE  05/21/2018    Social History   Tobacco Use  . Smoking status: Current Every Day Smoker    Years: 13.00    Types: Cigarettes  . Smokeless tobacco: Never Used  Substance Use Topics  . Alcohol use: Not Currently    Comment: rarely  . Drug use: Yes    Types: Marijuana    Comment: daily  family history includes Lupus in his mother; Other in his father and sister.  Review of Systems Per hpi Physical Exam   BP 128/85   Pulse 61   Temp 98.2 F (36.8 C)   Wt 233 lb 6.4 oz (105.9 kg)  BMI 29.17 kg/m   Physical Exam  Constitutional: He is oriented to person, place, and time. He appears well-developed and well-nourished. No distress.  HENT:  Mouth/Throat: Oropharynx is clear and moist. No oropharyngeal exudate.  Pulmonary/Chest: Effort normal and breath sounds normal. No respiratory distress. He has no wheezes.  Abdominal: Soft. Bowel sounds are normal. He exhibits no distension. There is no tenderness.  Lymphadenopathy:  He has no cervical adenopathy.  Neurological: He is alert and oriented to person, place, and time.  Skin: Skin is warm and dry. No rash noted. No erythema.  Psychiatric: He has a normal mood and affect. His behavior is normal.     Assessment and  Plan  Testing suggests he may have been exposed to lyme disease ( but unable to say when in time he may have had this exposure). He did reside in lyme endemic area (but unlikely that it would have been here in [place])  His present symptomatology is not suggestive of lyme disease. 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 Unsure if this was due to nerve entrapment. Consider referral to neurology  Chronic headache and neck pain = recommended other otc regimens to manage headache.

## 2018-07-14 NOTE — Telephone Encounter (Signed)
  Follow up Call-  Call back number 07/13/2018  Post procedure Call Back phone  # [phone]  Permission to leave phone message Yes     Patient questions:  Do you have a fever, pain , or abdominal swelling? No. Pain Score  0 *  Have you tolerated food without any problems? Yes.    Have you been able to return to your normal activities? Yes.    Do you have any questions about your discharge instructions: Diet   No. Medications  No. Follow up visit  No.  Do you have questions or concerns about your Care? No.  Actions: * If pain score is 4 or above: No action needed, pain <4.

## 2018-07-24 ENCOUNTER — Encounter: Payer: Self-pay | Admitting: Family Medicine

## 2018-07-24 ENCOUNTER — Ambulatory Visit: Payer: BLUE CROSS/BLUE SHIELD | Admitting: Family Medicine

## 2018-07-24 VITALS — BP 118/86 | HR 91 | Temp 98.4°F | Ht 75.0 in | Wt 227.2 lb

## 2018-07-24 DIAGNOSIS — J069 Acute upper respiratory infection, unspecified: Secondary | ICD-10-CM | POA: Diagnosis not present

## 2018-07-24 DIAGNOSIS — G629 Polyneuropathy, unspecified: Secondary | ICD-10-CM | POA: Diagnosis not present

## 2018-07-24 DIAGNOSIS — G894 Chronic pain syndrome: Secondary | ICD-10-CM | POA: Diagnosis not present

## 2018-07-24 NOTE — Patient Instructions (Signed)
Upper Respiratory Infection, Adult Most upper respiratory infections (URIs) are caused by a virus. A URI affects the nose, throat, and upper air passages. The most common type of URI is often called "the common cold." Follow these instructions at home:  Take medicines only as told by your doctor.  Gargle warm saltwater or take cough drops to comfort your throat as told by your doctor.  Use a warm mist humidifier or inhale steam from a shower to increase air moisture. This may make it easier to breathe.  Drink enough fluid to keep your pee (urine) clear or pale yellow.  Eat soups and other clear broths.  Have a healthy diet.  Rest as needed.  Go back to work when your fever is gone or your doctor says it is okay. ? You may need to stay home longer to avoid giving your URI to others. ? You can also wear a face mask and wash your hands often to prevent spread of the virus.  Use your inhaler more if you have asthma.  Do not use any tobacco products, including cigarettes, chewing tobacco, or electronic cigarettes. If you need help quitting, ask your doctor. Contact a doctor if:  You are getting worse, not better.  Your symptoms are not helped by medicine.  You have chills.  You are getting more short of breath.  You have brown or red mucus.  You have yellow or brown discharge from your nose.  You have pain in your face, especially when you bend forward.  You have a fever.  You have puffy (swollen) neck glands.  You have pain while swallowing.  You have white areas in the back of your throat. Get help right away if:  You have very bad or constant: ? Headache. ? Ear pain. ? Pain in your forehead, behind your eyes, and over your cheekbones (sinus pain). ? Chest pain.  You have long-lasting (chronic) lung disease and any of the following: ? Wheezing. ? Long-lasting cough. ? Coughing up blood. ? A change in your usual mucus.  You have a stiff neck.  You have  changes in your: ? Vision. ? Hearing. ? Thinking. ? Mood. This information is not intended to replace advice given to you by your health care provider. Make sure you discuss any questions you have with your health care provider. Document Released: 03/25/2008 Document Revised: 06/09/2016 Document Reviewed: 01/12/2014 Elsevier Interactive Patient Education  2018 [employer]  

## 2018-07-24 NOTE — Assessment & Plan Note (Addendum)
-  Unclear etiology as pain seems to move around.  -?Fibromyalgia.  -Referral placed to neurology.

## 2018-07-24 NOTE — Progress Notes (Signed)
Garrett Cunningham - 32 y.o. male MRN [id number]  Date of birth: Nov 29, 1985  Subjective Chief Complaint  Patient presents with  . Cough    started monday   . Nasal Congestion    HPI Garrett Cunningham is a 32 y.o. male here today with complaint of cough and congestion.  He reports that symptoms began about 4 days ago.  Symptoms have improved today with less coughing.  He is taking mucinex which has been helpful.  He denies fever, chills, nausea or vomiting, shortness of breath, wheezing, sinus pain.   He also continues to have pain around his ribs and down the back.  Also having pain around the shoulder blade as well.  Describes as burning pain with some tingling.  Lyme serology positive and seen by ID however this was thought to be from previous infection and did not have signs of active Lyme disease.  They recommended perhaps an appt with neuro which he would like to pursue.  ROS:  A comprehensive ROS was completed and negative except as noted per HPI  No Known Allergies  Past Medical History:  Diagnosis Date  . Myalgia     Past Surgical History:  Procedure Laterality Date  . HERNIA REPAIR Left 2018    Social History   Socioeconomic History  . Marital status: Married    Spouse name: [name]  . Number of children: 1  . Years of education: Not on file  . Highest education level: 12th grade  Occupational History  . Occupation: Civil Service fast streamer    Comment: [employer]  Social Needs  . Financial resource strain: Not on file  . Food insecurity:    Worry: Not on file    Inability: Not on file  . Transportation needs:    Medical: Not on file    Non-medical: Not on file  Tobacco Use  . Smoking status: Current Every Day Smoker    Years: 13.00    Types: Cigarettes  . Smokeless tobacco: Never Used  Substance and Sexual Activity  . Alcohol use: Not Currently    Comment: rarely  . Drug use: Yes    Types: Marijuana    Comment: daily  . Sexual activity: Yes  Lifestyle  . Physical  activity:    Days per week: Not on file    Minutes per session: Not on file  . Stress: Not on file  Relationships  . Social connections:    Talks on phone: Not on file    Gets together: Not on file    Attends religious service: Not on file    Active member of club or organization: Not on file    Attends meetings of clubs or organizations: Not on file    Relationship status: Not on file  Other Topics Concern  . Not on file  Social History Narrative  . Not on file    Family History  Problem Relation Age of Onset  . Lupus Mother   . Other Sister        RSD  . Other Father        hx unknown   . Colon cancer Neg Hx   . Esophageal cancer Neg Hx   . Rectal cancer Neg Hx     Health Maintenance  Topic Date Due  . HIV Screening  01/01/2001  . TETANUS/TDAP  01/01/2005  . INFLUENZA VACCINE  05/21/2018    ----------------------------------------------------------------------------------------------------------------------------------------------------------------------------------------------------------------- Physical Exam BP 118/86 (BP Location: Right Arm, Patient Position: Sitting, Cuff Size: Normal)  Pulse 91   Temp 98.4 F (36.9 C) (Oral)   Ht 6\' 3"  (1.905 m)   Wt 227 lb 3.2 oz (103.1 kg)   SpO2 96%   BMI 28.40 kg/m   Physical Exam  Constitutional: He is oriented to person, place, and time. He appears well-nourished. No distress.  HENT:  Head: Normocephalic and atraumatic.  Mouth/Throat: Oropharynx is clear and moist.  Eyes: No scleral icterus.  Neck: Neck supple.  Cardiovascular: Normal rate, regular rhythm and normal heart sounds.  Pulmonary/Chest: Effort normal and breath sounds normal.  Lymphadenopathy:    He has no cervical adenopathy.  Neurological: He is alert and oriented to person, place, and time.  Skin: Skin is warm and dry. No rash noted.  Psychiatric: He has a normal mood and affect. His behavior is normal.     ------------------------------------------------------------------------------------------------------------------------------------------------------------------------------------------------------------------- Assessment and Plan  Chronic pain syndrome -Unclear etiology as pain seems to move around.  -?Fibromyalgia.  -Referral placed to neurology.   Viral upper respiratory tract infection -Improving, continue supportive care including rest and remaining well hydrated.

## 2018-07-24 NOTE — Assessment & Plan Note (Signed)
-  Improving, continue supportive care including rest and remaining well hydrated.

## 2018-08-07 ENCOUNTER — Encounter: Payer: Self-pay | Admitting: Family Medicine

## 2018-08-10 DIAGNOSIS — M5413 Radiculopathy, cervicothoracic region: Secondary | ICD-10-CM | POA: Diagnosis not present

## 2018-08-10 DIAGNOSIS — M9901 Segmental and somatic dysfunction of cervical region: Secondary | ICD-10-CM | POA: Diagnosis not present

## 2018-08-10 DIAGNOSIS — M5412 Radiculopathy, cervical region: Secondary | ICD-10-CM | POA: Diagnosis not present

## 2018-08-10 DIAGNOSIS — M9902 Segmental and somatic dysfunction of thoracic region: Secondary | ICD-10-CM | POA: Diagnosis not present

## 2018-08-14 ENCOUNTER — Ambulatory Visit: Payer: BLUE CROSS/BLUE SHIELD | Admitting: Family Medicine

## 2018-09-14 ENCOUNTER — Ambulatory Visit (INDEPENDENT_AMBULATORY_CARE_PROVIDER_SITE_OTHER): Payer: BLUE CROSS/BLUE SHIELD

## 2018-09-14 ENCOUNTER — Ambulatory Visit: Payer: BLUE CROSS/BLUE SHIELD | Admitting: Family Medicine

## 2018-09-14 VITALS — BP 102/80 | HR 78 | Temp 98.3°F | Wt 230.0 lb

## 2018-09-14 DIAGNOSIS — F17210 Nicotine dependence, cigarettes, uncomplicated: Secondary | ICD-10-CM | POA: Diagnosis not present

## 2018-09-14 DIAGNOSIS — R059 Cough, unspecified: Secondary | ICD-10-CM | POA: Insufficient documentation

## 2018-09-14 DIAGNOSIS — R0789 Other chest pain: Secondary | ICD-10-CM | POA: Diagnosis not present

## 2018-09-14 DIAGNOSIS — R05 Cough: Secondary | ICD-10-CM | POA: Diagnosis not present

## 2018-09-14 MED ORDER — VARENICLINE TARTRATE 0.5 MG X 11 & 1 MG X 42 PO MISC: ORAL | 0 refills | Status: DC

## 2018-09-14 MED ORDER — FLUTICASONE PROPIONATE 50 MCG/ACT NA SUSP: 2.0000 | Freq: Every day | NASAL | 6 refills | Status: DC

## 2018-09-14 NOTE — Patient Instructions (Signed)
Costochondritis Costochondritis is swelling and irritation (inflammation) of the tissue (cartilage) that connects your ribs to your breastbone (sternum). This causes pain in the front of your chest. The pain usually starts gradually and involves more than one rib. What are the causes? The exact cause of this condition is not always known. It results from stress on the cartilage where your ribs attach to your sternum. The cause of this stress could be:  Chest injury (trauma).  Exercise or activity, such as lifting.  Severe coughing.  What increases the risk? You may be at higher risk for this condition if you:  Are male.  Are 30?32 years old.  Recently started a new exercise or work activity.  Have low levels of vitamin D.  Have a condition that makes you cough frequently.  What are the signs or symptoms? The main symptom of this condition is chest pain. The pain:  Usually starts gradually and can be sharp or dull.  Gets worse with deep breathing, coughing, or exercise.  Gets better with rest.  May be worse when you press on the sternum-rib connection (tenderness).  How is this diagnosed? This condition is diagnosed based on your symptoms, medical history, and a physical exam. Your health care provider will check for tenderness when pressing on your sternum. This is the most important finding. You may also have tests to rule out other causes of chest pain. These may include:  A chest X-ray to check for lung problems.  An electrocardiogram (ECG) to see if you have a heart problem that could be causing the pain.  An imaging scan to rule out a chest or rib fracture.  How is this treated? This condition usually goes away on its own over time. Your health care provider may prescribe an NSAID to reduce pain and inflammation. Your health care provider may also suggest that you:  Rest and avoid activities that make pain worse.  Apply heat or cold to the area to reduce pain  and inflammation.  Do exercises to stretch your chest muscles.  If these treatments do not help, your health care provider may inject a numbing medicine at the sternum-rib connection to help relieve the pain. Follow these instructions at home:  Avoid activities that make pain worse. This includes any activities that use chest, abdominal, and side muscles.  If directed, put ice on the painful area: ? Put ice in a plastic bag. ? Place a towel between your skin and the bag. ? Leave the ice on for 20 minutes, 2-3 times a day.  If directed, apply heat to the affected area as often as told by your health care provider. Use the heat source that your health care provider recommends, such as a moist heat pack or a heating pad. ? Place a towel between your skin and the heat source. ? Leave the heat on for 20-30 minutes. ? Remove the heat if your skin turns bright red. This is especially important if you are unable to feel pain, heat, or cold. You may have a greater risk of getting burned.  Take over-the-counter and prescription medicines only as told by your health care provider.  Return to your normal activities as told by your health care provider. Ask your health care provider what activities are safe for you.  Keep all follow-up visits as told by your health care provider. This is important. Contact a health care provider if:  You have chills or a fever.  Your pain does not go   away or it gets worse.  You have a cough that does not go away (is persistent). Get help right away if:  You have shortness of breath. This information is not intended to replace advice given to you by your health care provider. Make sure you discuss any questions you have with your health care provider. Document Released: 07/17/2005 Document Revised: 04/26/2016 Document Reviewed: 01/31/2016 Elsevier Interactive Patient Education  2018 Elsevier Inc.   

## 2018-09-14 NOTE — Assessment & Plan Note (Signed)
-  Tried and failed patches and gum -Discussed trying chantix -Reviewed side effects/black box warnings -F/u in 6-8 weeks.   >10 minutes spent discussing smoking cessation

## 2018-09-14 NOTE — Progress Notes (Signed)
Garrett SayersJonathan A Cunningham - 32 y.o. male MRN [id number]  Date of birth: Feb 26, 1986  Subjective Chief Complaint  Patient presents with  . Nasal Congestion  . Cough    HPI Garrett Cunningham is a 32 y.o. male here today with complaint of the following:   -Cough/Congestion:  Chronic cough, nasal congestion and post nasal drip.  Cough is sometimes productive of clear phlegm.  He denies shortness of breath, wheezing, or fever.   He does feel like he has nasal drainage that causes him to cough.  He is a smoker but is interested in quitting.  He has tried nicotine patches and gum without success.    -Rib pain:  Pain along lower R rib area along costochondral junction.  Seen by chiropractor and had significant pain with manipulation of this.  Has had some improvement with heat.  He is interested in quitting smoking as he thinks this may be contributing some as well.   ROS:  A comprehensive ROS was completed and negative except as noted per HPI  No Known Allergies  Past Medical History:  Diagnosis Date  . Myalgia     Past Surgical History:  Procedure Laterality Date  . HERNIA REPAIR Left 2018    Social History   Socioeconomic History  . Marital status: Married    Spouse name: Clearnce HastenJami  . Number of children: 1  . Years of education: Not on file  . Highest education level: 12th grade  Occupational History  . 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 Financial resource strain: Not on file  . Food insecurity:    Worry: Not on file    Inability: Not on file  . Transportation needs:    Medical: Not on file    Non-medical: Not on file  Tobacco Use  . Smoking status: Current Every Day Smoker    Years: 13.00    Types: Cigarettes  . Smokeless tobacco: Never Used  Substance and Sexual Activity  . Alcohol use: Not Currently    Comment: rarely  . Drug use: Yes    Types: Marijuana    Comment: daily  . Sexual activity: Yes  Lifestyle  . Physical activity:    Days per  week: Not on file    Minutes per session: Not on file  . Stress: Not on file  Relationships  . Social connections:    Talks on phone: Not on file    Gets together: Not on file    Attends religious service: Not on file    Active member of club or organization: Not on file    Attends meetings of clubs or organizations: Not on file    Relationship status: Not on file  Other Topics Concern  . Not on file  Social History Narrative  . Not on file    Family History  Problem Relation Age of Onset  . Lupus Mother   . Other Sister        RSD  . Other Father        hx unknown   . Colon cancer Neg Hx   . Esophageal cancer Neg Hx   . Rectal cancer Neg Hx     Health Maintenance  Topic Date Due  . HIV Screening  01/01/2001  . TETANUS/TDAP  01/01/2005  . INFLUENZA VACCINE  05/21/2018    ----------------------------------------------------------------------------------------------------------------------------------------------------------------------------------------------------------------- Physical Exam BP 102/80   Pulse 78   Temp 98.3 F (36.8 C)   Wt 230 lb (  104.3 kg)   SpO2 99%   BMI 28.75 kg/m   Physical Exam  Constitutional: He is oriented to person, place, and time. He appears well-nourished. No distress.  HENT:  Head: Normocephalic and atraumatic.  Mouth/Throat: Oropharynx is clear and moist.  Eyes: No scleral icterus.  Neck: Neck supple. No thyromegaly present.  Cardiovascular: Normal rate, regular rhythm and normal heart sounds.  Pulmonary/Chest: Effort normal and breath sounds normal. He exhibits tenderness (TTP along costochondral junction on R. ).  Lymphadenopathy:    He has no cervical adenopathy.  Neurological: He is alert and oriented to person, place, and time.  Skin: Skin is warm and dry.  Psychiatric: He has a normal mood and affect. His behavior is normal.     ------------------------------------------------------------------------------------------------------------------------------------------------------------------------------------------------------------------- Assessment and Plan  Chest wall pain -Trial of pennsaid, provided samples -CXR for chronic pain and cough.  -Can consider trial of acupuncture as well.   Cough -Likely 2/2 to allergic rhinitis and post nasal drainage -trial of flonase -Recommend humidifier in house as well.   Nicotine dependence, cigarettes, uncomplicated -Tried and failed patches and gum -Discussed trying chantix -Reviewed side effects/black box warnings -F/u in 6-8 weeks.   >10 minutes spent discussing smoking cessation

## 2018-09-14 NOTE — Assessment & Plan Note (Signed)
-  Likely 2/2 to allergic rhinitis and post nasal drainage -trial of flonase -Recommend humidifier in house as well.

## 2018-09-14 NOTE — Assessment & Plan Note (Signed)
-  Trial of pennsaid, provided samples -CXR for chronic pain and cough.  -Can consider trial of acupuncture as well.

## 2018-09-15 DIAGNOSIS — R05 Cough: Secondary | ICD-10-CM | POA: Diagnosis not present

## 2018-09-16 ENCOUNTER — Encounter: Payer: Self-pay | Admitting: Family Medicine

## 2018-09-16 NOTE — Progress Notes (Signed)
-  there appears to be some scarring in the R lung.  This may be from a previous infection.  Recommend repeat xray in 6 months to be sure this is not changing.  -Some signs of chronic bronchitis, likely related to smoking.  Encourage that he quit smoking.

## 2018-09-24 ENCOUNTER — Ambulatory Visit: Payer: Self-pay | Admitting: Neurology

## 2018-10-27 ENCOUNTER — Encounter

## 2018-10-27 ENCOUNTER — Encounter: Payer: Self-pay | Admitting: Diagnostic Neuroimaging

## 2018-10-27 ENCOUNTER — Ambulatory Visit: Payer: BLUE CROSS/BLUE SHIELD | Admitting: Diagnostic Neuroimaging

## 2018-10-27 VITALS — BP 113/74 | HR 72 | Ht 75.0 in | Wt 229.4 lb

## 2018-10-27 DIAGNOSIS — R52 Pain, unspecified: Secondary | ICD-10-CM | POA: Diagnosis not present

## 2018-10-27 DIAGNOSIS — R200 Anesthesia of skin: Secondary | ICD-10-CM | POA: Diagnosis not present

## 2018-10-27 DIAGNOSIS — M791 Myalgia, unspecified site: Secondary | ICD-10-CM | POA: Diagnosis not present

## 2018-10-27 DIAGNOSIS — R799 Abnormal finding of blood chemistry, unspecified: Secondary | ICD-10-CM | POA: Diagnosis not present

## 2018-10-27 NOTE — Progress Notes (Signed)
[facility]  PATIENT: Garrett Cunningham DOB: [birth year]  REFERRING CLINICIAN: Gilda Crease, DO HISTORY FROM: patient REASON FOR VISIT: new consult    HISTORICAL  CHIEF COMPLAINT:  Chief Complaint  Patient presents with  . NP Referred by Dr. Luetta Nutting    Rm 7, alone  . Polyneuropathy    Pain ear to chest for over 1.66yr, constant.    HISTORY OF PRESENT ILLNESS:   33year old male here for evaluation of pain.  July 2018 patient underwent left inguinal hernia surgery repair.  He had been having abnormal sensation and a palpable bulge in that area for several months.  Postoperatively patient had some discomfort at the site of the surgery.  Over the next 2 to 3 months symptoms changed.  Symptoms began to radiate around the left flank region towards his lower back.  6 months postoperatively he developed symptoms in his left pectoral region.  9 months postoperatively he felt symptoms radiating around the right side of his torso.  Now patient feeling abnormal sensation in his jaw, neck, shoulders.  No change in bowel or bladder function.  No change in gait or coordination.  He has had evaluation with CT of the abdomen and pelvis, ultrasound of the abdomen, upper GI endoscopy, and lab testing to rule out autoimmune, inflammatory and infectious etiologies.  He is tried variety of medications including gabapentin, PPI, and other medications for pain control.  He was evaluated pain management specialist.  No specific etiology has been found for his symptoms.   REVIEW OF SYSTEMS: Full 14 system review of systems performed and negative with exception of: Headache chest pain cough aching muscle skin sensitivity.  ALLERGIES: No Known Allergies  HOME MEDICATIONS: Outpatient Medications Prior to Visit  Medication Sig Dispense Refill  . MULTIPLE VITAMIN PO Take by mouth. 2 gummies daily    . varenicline (CHANTIX STARTING MONTH PAK) 0.5 MG X 11 & 1 MG X 42 tablet Take one  0.5 mg tab PO once daily for 3 days, then increase to one 0.5 mg tab BID for 4 days, then increase to one 1 mg tablet BID 53 tablet 0  . fluticasone (FLONASE) 50 MCG/ACT nasal spray Place 2 sprays into both nostrils daily. 16 g 6  . Probiotic Product (PROBIOTIC DAILY PO) Take 2 capsules by mouth 2 (two) times daily.     No facility-administered medications prior to visit.     PAST MEDICAL HISTORY: Past Medical History:  Diagnosis Date  . Myalgia     PAST SURGICAL HISTORY: Past Surgical History:  Procedure Laterality Date  . HERNIA REPAIR Left 2018    FAMILY HISTORY: Family History  Problem Relation Age of Onset  . Lupus Mother   . Other Sister        RSD  . Other Father        hx unknown   . Colon cancer Neg Hx   . Esophageal cancer Neg Hx   . Rectal cancer Neg Hx     SOCIAL HISTORY: Social History   Socioeconomic History  . Marital status: Married    Spouse name: JWende Crease . Number of children: 1  . Years of education: Not on file  . Highest education level: 12th grade  Occupational History  . Occupation: REnglish as a second language teacher   [place] [employer] . Financial resource strain: Not on file  . Food insecurity:    Worry: Not on file    Inability: Not on file  .  Transportation needs:    Medical: Not on file    Non-medical: Not on file  Tobacco Use  . Smoking status: Current Every Day Smoker    Packs/day: 1.00    Years: 13.00    Pack years: 13.00    Types: Cigarettes  . Smokeless tobacco: Never Used  Substance and Sexual Activity  . Alcohol use: Not Currently    Comment: rarely, quit 03/2018  . Drug use: Yes    Types: Marijuana    Comment: daily  . Sexual activity: Yes  Lifestyle  . Physical activity:    Days per week: Not on file    Minutes per session: Not on file  . Stress: Not on file  Relationships  . Social connections:    Talks on phone: Not on file    Gets together: Not on file    Attends religious service: Not on file    Active member  of club or organization: Not on file    Attends meetings of clubs or organizations: Not on file    Relationship status: Not on file  . Intimate partner violence:    Fear of current or ex partner: Not on file    Emotionally abused: Not on file    Physically abused: Not on file    Forced sexual activity: Not on file  Other Topics Concern  . Not on file  Social History Narrative   Lives home with wife and daughter.  Works for [employer].  Education:  HS/ some college.  Children one.  Caffeine daily.      PHYSICAL EXAM  GENERAL EXAM/CONSTITUTIONAL: Vitals:  Vitals:   10/27/18 0824  BP: 113/74  Pulse: 72  Weight: 229 lb 6.4 oz (104.1 kg)  Height: 6' 3" (1.905 m)     Body mass index is 28.67 kg/m. Wt Readings from Last 3 Encounters:  10/27/18 229 lb 6.4 oz (104.1 kg)  09/14/18 230 lb (104.3 kg)  07/24/18 227 lb 3.2 oz (103.1 kg)     Patient is in no distress; well developed, nourished and groomed; neck is supple  CARDIOVASCULAR:  Examination of carotid arteries is normal; no carotid bruits  Regular rate and rhythm, no murmurs  Examination of peripheral vascular system by observation and palpation is normal  EYES:  Ophthalmoscopic exam of optic discs and posterior segments is normal; no papilledema or hemorrhages  Visual Acuity Screening   Right eye Left eye Both eyes  Without correction:     With correction: 20/20 20/20      MUSCULOSKELETAL:  Gait, strength, tone, movements noted in Neurologic exam below  NEUROLOGIC: MENTAL STATUS:  No flowsheet data found.  awake, alert, oriented to person, place and time  recent and remote memory intact  normal attention and concentration  language fluent, comprehension intact, naming intact  fund of knowledge appropriate  CRANIAL NERVE:   2nd - no papilledema on fundoscopic exam  2nd, 3rd, 4th, 6th - pupils equal and reactive to light, visual fields full to confrontation, extraocular muscles  intact, no nystagmus  5th - facial sensation symmetric  7th - facial strength symmetric  8th - hearing intact  9th - palate elevates symmetrically, uvula midline  11th - shoulder shrug symmetric  12th - tongue protrusion midline  MOTOR:   normal bulk and tone, full strength in the BUE, BLE  SENSORY:   normal and symmetric to light touch, temperature, vibration  COORDINATION:   finger-nose-finger, fine finger movements normal  REFLEXES:   deep  tendon reflexes BRISK and symmetric  DOWN GOING TOES ON PLANTAR STIM  NEG HOFFMAN'S  GAIT/STATION:   narrow based gait; romberg is negative     DIAGNOSTIC DATA (LABS, IMAGING, TESTING) - I reviewed patient records, labs, notes, testing and imaging myself where available.  No results found for: WBC, HGB, HCT, MCV, PLT No results found for: NA, K, CL, CO2, GLUCOSE, BUN, CREATININE, CALCIUM, PROT, ALBUMIN, AST, ALT, ALKPHOS, BILITOT, GFRNONAA, GFRAA No results found for: CHOL, HDL, LDLCALC, LDLDIRECT, TRIG, CHOLHDL No results found for: HGBA1C No results found for: VITAMINB12 No results found for: TSH   11/13/17 CT abd / pelvis - No significant abnormality on CT of the pelvis. No recurrent inguinal hernia is seen, with scarring at the site of the prior left inguinal hernia.  03/18/18 MRI lumbar spine  - Small central disc protrusion L5-S1 with small central annular fissure. Negative for neural impingement or stenosis.   02/12/2018 labs CRP 0.7 ESR 2 Aldolase 5.5 CK 177 RF negative ANA negative    ASSESSMENT AND PLAN  33 y.o. year old male here with abnormal pain sensation, following left inguinal hernia surgery July 2018.  Symptoms progressively increasing and spreading throughout his body.  Unclear etiology at this point.  Will check lab testing.  Offered to check MRI of cervical and thoracic spine to rule out other central nervous system etiologies.  Patient expressed concern about already having done multiple  lab and imaging testings in the past.  He would like to think about this before proceeding.   Dx:  1. Numbness   2. Pain     PLAN:  - check B12, TSH, A1c - consider MRI cervical and thoracic spine (some hyperreflexia noted; but patient think about it and let us know)  Orders Placed This Encounter  Procedures  . Vitamin B12  . Hemoglobin A1c  . TSH   Return if symptoms worsen or fail to improve.    Penni Bombard, MD 0/12/1592, 5:85 AM Certified in Neurology, Neurophysiology and Neuroimaging  [facility] [address], [place], [place] [address] [phone]

## 2018-10-27 NOTE — Progress Notes (Deleted)
[facility]  PATIENT: Garrett Cunningham DOB: 16-Aug-1986  REFERRING CLINICIAN: Dr Everrett Coombeody Matthews HISTORY FROM: Patient  REASON FOR VISIT: progressing pain, sharp    HISTORICAL  CHIEF COMPLAINT:  Chief Complaint  Patient presents with  . NP Referred by Dr. Everrett Coombecody Matthews    Rm 7, alone  . Polyneuropathy    Pain ear to chest for over 1.649yrs, constant.    HISTORY OF PRESENT ILLNESS:  ***  04/2017 surgery for left inguinal hernia. He continued to feel sharp stabbing pain over left hip. Was seen by ortho who performed a CT pelvis in 10/2017 that was reportedly normal. He was seen by PCP after pain progressed. Sharp pain started in bilateral ribs about 6 months ago and now is in bilateral posterior shoulders, sides of neck and mid chest. He has also noted low back pain but denies radiation of pain to lower extremities. He had positive IGG/IGM for Lyme Disease but was cleared by ID. Her denies numbness or tingling. He denies changes in bowel or bladder habits.   REVIEW OF SYSTEMS: Full 14 system review of systems performed and negative with exception of: ***  ALLERGIES: No Known Allergies  HOME MEDICATIONS: Outpatient Medications Prior to Visit  Medication Sig Dispense Refill  . MULTIPLE VITAMIN PO Take by mouth. 2 gummies daily    . varenicline (CHANTIX STARTING MONTH PAK) 0.5 MG X 11 & 1 MG X 42 tablet Take one 0.5 mg tab PO once daily for 3 days, then increase to one 0.5 mg tab BID for 4 days, then increase to one 1 mg tablet BID 53 tablet 0  . fluticasone (FLONASE) 50 MCG/ACT nasal spray Place 2 sprays into both nostrils daily. 16 g 6  . Probiotic Product (PROBIOTIC DAILY PO) Take 2 capsules by mouth 2 (two) times daily.     No facility-administered medications prior to visit.     PAST MEDICAL HISTORY: Past Medical History:  Diagnosis Date  . Myalgia     PAST SURGICAL HISTORY: Past Surgical History:  Procedure Laterality Date  . HERNIA REPAIR Left 2018     FAMILY HISTORY: Family History  Problem Relation Age of Onset  . Lupus Mother   . Other Sister        RSD  . Other Father        hx unknown   . Colon cancer Neg Hx   . Esophageal cancer Neg Hx   . Rectal cancer Neg Hx     SOCIAL HISTORY: Social History   Socioeconomic History  . Marital status: Married    Spouse name: Clearnce HastenJami  . Number of children: 1  . Years of education: Not on file  . Highest education level: 12th grade  Occupational History  . 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 Financial resource strain: Not on file  . Food insecurity:    Worry: Not on file    Inability: Not on file  . Transportation needs:    Medical: Not on file    Non-medical: Not on file  Tobacco Use  . Smoking status: Current Every Day Smoker    Packs/day: 1.00    Years: 13.00    Pack years: 13.00    Types: Cigarettes  . Smokeless tobacco: Never Used  Substance and Sexual Activity  . Alcohol use: Not Currently    Comment: rarely, quit 03/2018  . Drug use: Yes    Types: Marijuana    Comment: daily  .  Sexual activity: Yes  Lifestyle  . Physical activity:    Days per week: Not on file    Minutes per session: Not on file  . Stress: Not on file  Relationships  . Social connections:    Talks on phone: Not on file    Gets together: Not on file    Attends religious service: Not on file    Active member of club or organization: Not on file    Attends meetings of clubs or organizations: Not on file    Relationship status: Not on file  . Intimate partner violence:    Fear of current or ex partner: Not on file    Emotionally abused: Not on file    Physically abused: Not on file    Forced sexual activity: Not on file  Other Topics Concern  . Not on file  Social History Narrative   Lives home with wife and daughter.  Works for [employer].  Education:  HS/ some college.  Children one.  Caffeine daily.      PHYSICAL EXAM ***  GENERAL  EXAM/CONSTITUTIONAL: Vitals:  Vitals:   10/27/18 0824  BP: 113/74  Pulse: 72  Weight: 229 lb 6.4 oz (104.1 kg)  Height: 6\' 3"  (1.905 m)     Body mass index is 28.67 kg/m. Wt Readings from Last 3 Encounters:  10/27/18 229 lb 6.4 oz (104.1 kg)  09/14/18 230 lb (104.3 kg)  07/24/18 227 lb 3.2 oz (103.1 kg)     Patient is in no distress; well developed, nourished and groomed; neck is supple  CARDIOVASCULAR:  Examination of carotid arteries is normal; no carotid bruits  Regular rate and rhythm, no murmurs  Examination of peripheral vascular system by observation and palpation is normal  EYES:  Ophthalmoscopic exam of optic discs and posterior segments is normal; no papilledema or hemorrhages  Visual Acuity Screening   Right eye Left eye Both eyes  Without correction:     With correction: 20/20 20/20      MUSCULOSKELETAL:  Gait, strength, tone, movements noted in Neurologic exam below  NEUROLOGIC: MENTAL STATUS:  No flowsheet data found.  awake, alert, oriented to person, place and time  recent and remote memory intact  normal attention and concentration  language fluent, comprehension intact, naming intact  fund of knowledge appropriate  CRANIAL NERVE:   2nd - no papilledema on fundoscopic exam  2nd, 3rd, 4th, 6th - pupils equal and reactive to light, visual fields full to confrontation, extraocular muscles intact, no nystagmus  5th - facial sensation symmetric  7th - facial strength symmetric  8th - hearing intact  9th - palate elevates symmetrically, uvula midline  11th - shoulder shrug symmetric  12th - tongue protrusion midline  MOTOR:   normal bulk and tone, full strength in the BUE, BLE  SENSORY:   normal and symmetric to light touch, pinprick, temperature, vibration  COORDINATION:   finger-nose-finger, fine finger movements normal  REFLEXES:   deep tendon reflexes present and symmetric  GAIT/STATION:   narrow based gait;  able to walk on toes, heels and tandem; romberg is negative     DIAGNOSTIC DATA (LABS, IMAGING, TESTING) - I reviewed patient records, labs, notes, testing and imaging myself where available.  No results found for: WBC, HGB, HCT, MCV, PLT No results found for: NA, K, CL, CO2, GLUCOSE, BUN, CREATININE, CALCIUM, PROT, ALBUMIN, AST, ALT, ALKPHOS, BILITOT, GFRNONAA, GFRAA No results found for: CHOL, HDL, LDLCALC, LDLDIRECT, TRIG, CHOLHDL No results  found for: HGBA1C No results found for: VITAMINB12 No results found for: TSH  ***    ASSESSMENT AND PLAN  33 y.o. year old male here with ***  Localization:  Ddx:  No diagnosis found.    PLAN: ***  No orders of the defined types were placed in this encounter.   No orders of the defined types were placed in this encounter.   No follow-ups on file.

## 2018-10-28 LAB — TSH: TSH: 1.72 u[IU]/mL (ref 0.450–4.500)

## 2018-10-28 LAB — HEMOGLOBIN A1C
Est. average glucose Bld gHb Est-mCnc: 103 mg/dL
HEMOGLOBIN A1C: 5.2 % (ref 4.8–5.6)

## 2018-10-28 LAB — VITAMIN B12: Vitamin B-12: 376 pg/mL (ref 232–1245)

## 2018-11-04 ENCOUNTER — Telehealth: Payer: Self-pay | Admitting: Neurology

## 2018-11-04 NOTE — Telephone Encounter (Signed)
Called and spoke with the patient and informed him of the normal labs. Pt verbalized understanding. Pt had no questions at this time but was encouraged to call back if questions arise.

## 2018-11-04 NOTE — Telephone Encounter (Signed)
-----   Message from Suanne Marker, MD sent at 11/04/2018  1:18 PM EST ----- Normal labs. Please call patient. -VRP

## 2019-01-20 ENCOUNTER — Encounter: Payer: Self-pay | Admitting: Family Medicine

## 2019-01-20 ENCOUNTER — Ambulatory Visit (INDEPENDENT_AMBULATORY_CARE_PROVIDER_SITE_OTHER): Payer: BLUE CROSS/BLUE SHIELD | Admitting: Family Medicine

## 2019-01-20 VITALS — Temp 98.5°F | Wt 216.0 lb

## 2019-01-20 DIAGNOSIS — F17210 Nicotine dependence, cigarettes, uncomplicated: Secondary | ICD-10-CM | POA: Diagnosis not present

## 2019-01-20 DIAGNOSIS — R0789 Other chest pain: Secondary | ICD-10-CM | POA: Diagnosis not present

## 2019-01-20 MED ORDER — GABAPENTIN 300 MG PO CAPS: 300.0000 mg | ORAL_CAPSULE | Freq: Three times a day (TID) | ORAL | 1 refills | Status: DC

## 2019-01-20 MED ORDER — VARENICLINE TARTRATE 0.5 MG X 11 & 1 MG X 42 PO MISC: ORAL | 0 refills | Status: DC

## 2019-01-20 NOTE — Assessment & Plan Note (Addendum)
-  Acute on chronic issue, new area.  -Will plan to repeat chest xray as scarring noted previously on imaging -Suspected neuropathic etiology contributing to his chronic pain.  Reviewed previous neuro notes and discussed MRI of Cervical and thoracic spine as recommended by Dr. Marjory Lies.  He would be open to this if not improving.  -Will give trial of gabapentin.  -He may continue muscle rubs as needed.

## 2019-01-20 NOTE — Assessment & Plan Note (Signed)
-  Reviewed side effects and instructions for chantix again.  Starting pack sent over.

## 2019-01-20 NOTE — Progress Notes (Signed)
[name] - 33 y.o. male MRN [id number]  Date of birth: 02-16-1986   This visit type was conducted due to national recommendations for restrictions regarding the COVID-19 Pandemic (e.g. social distancing).  This format is felt to be most appropriate for this patient at this time.  All issues noted in this document were discussed and addressed.  No physical exam was performed (except for noted visual exam findings with Video Visits).  I discussed the limitations of evaluation and management by telemedicine and the availability of in person appointments. The patient expressed understanding and agreed to proceed.  I connected with@ on 01/20/19 at  3:30 PM EDT by a video enabled telemedicine application and verified that I am speaking with the correct person using two identifiers.   Patient Location: Work office [address] [place] [address]   Provider location:   [facility]  Chief Complaint  Patient presents with  . Pain    Chest >R side of chest , onset episodic , wants refill of Rx Chantix.     HPI  Garrett Cunningham is a 33 y.o. male who presents via audio/video conferencing for a telehealth visit today.  He has history of chronic pain, notably along R lower rib area as well as LLQ area.  Now has complaint of pain along R chest wall.  Describes as radiating from lower pectoral area to just below clavicle.  Reports pain is constant with radiates through the back.  Describes as sharp in nature.  Worsened with neck movement.  He denies any know overuse, strain or injury. He has not had cough, fever, shortness of breath.  He does continue to smoke.  Had success with chantix previously but was traveling and forgot medication and started smoking more again.  He would like to restart this.    ROS:  A comprehensive ROS was completed and negative except as noted per HPI  Past Medical History:  Diagnosis Date  . Myalgia     Past Surgical History:  Procedure  Laterality Date  . HERNIA REPAIR Left 2018    Family History  Problem Relation Age of Onset  . Lupus Mother   . Other Sister        RSD  . Other Father        hx unknown   . Colon cancer Neg Hx   . Esophageal cancer Neg Hx   . Rectal cancer Neg Hx     Social History   Socioeconomic History  . Marital status: Married    Spouse name: [name]  . Number of children: 1  . Years of education: Not on file  . Highest education level: 12th grade  Occupational History  . Occupation: Civil Service fast streamer    Comment: [employer]  Social Needs  . Financial resource strain: Not on file  . Food insecurity:    Worry: Not on file    Inability: Not on file  . Transportation needs:    Medical: Not on file    Non-medical: Not on file  Tobacco Use  . Smoking status: Current Every Day Smoker    Packs/day: 1.00    Years: 13.00    Pack years: 13.00    Types: Cigarettes  . Smokeless tobacco: Never Used  Substance and Sexual Activity  . Alcohol use: Not Currently    Comment: rarely, quit 03/2018  . Drug use: Yes    Types: Marijuana    Comment: daily  . Sexual activity: Yes  Lifestyle  .  Physical activity:    Days per week: Not on file    Minutes per session: Not on file  . Stress: Not on file  Relationships  . Social connections:    Talks on phone: Not on file    Gets together: Not on file    Attends religious service: Not on file    Active member of club or organization: Not on file    Attends meetings of clubs or organizations: Not on file    Relationship status: Not on file  . Intimate partner violence:    Fear of current or ex partner: Not on file    Emotionally abused: Not on file    Physically abused: Not on file    Forced sexual activity: Not on file  Other Topics Concern  . Not on file  Social History Narrative   Lives home with wife and daughter.  Works for [employer].  Education:  HS/ some college.  Children one.  Caffeine daily.      Current Outpatient  Medications:  Marland Kitchen  MULTIPLE VITAMIN PO, Take by mouth. 2 gummies daily, Disp: , Rfl:  .  gabapentin (NEURONTIN) 300 MG capsule, Take 1 capsule (300 mg total) by mouth 3 (three) times daily. Take at night only for the first 7 days., Disp: 90 capsule, Rfl: 1 .  varenicline (CHANTIX STARTING MONTH PAK) 0.5 MG X 11 & 1 MG X 42 tablet, Take one 0.5 mg tab PO once daily for 3 days, then increase to one 0.5 mg tab BID for 4 days, then increase to one 1 mg tablet BID, Disp: 53 tablet, Rfl: 0  EXAM:  VITALS per patient if applicable: Temp 98.5 F (36.9 C) (Temporal) Comment: Pt took at home .  Wt 216 lb (98 kg) Comment: wt at home on personal scales this am  BMI 27.00 kg/m   GENERAL: alert, oriented, appears well and in no acute distress  HEENT: atraumatic, conjunttiva clear  NECK: normal movements of the head and neck, increase pain with rotational/sidebending movement  LUNGS: on inspection no signs of respiratory distress, breathing rate appears normal, no obvious gross SOB, gasping or wheezing  CV: no obvious cyanosis  MS: moves all visible extremities without noticeable abnormality  PSYCH/NEURO: pleasant and cooperative, no obvious depression or anxiety, speech and thought processing grossly intact  ASSESSMENT AND PLAN:  Discussed the following assessment and plan:  Chest wall pain -Acute on chronic issue, new area.  -Will plan to repeat chest xray as scarring noted previously on imaging -Suspected neuropathic etiology contributing to his chronic pain.  Reviewed previous neuro notes and discussed MRI of Cervical and thoracic spine as recommended by Dr. Marjory Lies.  He would be open to this if not improving.  -Will give trial of gabapentin.  -He may continue muscle rubs as needed.    Nicotine dependence, cigarettes, uncomplicated -Reviewed side effects and instructions for chantix again.  Starting pack sent over.        I discussed the assessment and treatment plan with the  patient. The patient was provided an opportunity to ask questions and all were answered. The patient agreed with the plan and demonstrated an understanding of the instructions.   The patient was advised to call back or seek an in-person evaluation if the symptoms worsen or if the condition fails to improve as anticipated.    Everrett Coombe, DO

## 2019-01-25 ENCOUNTER — Other Ambulatory Visit: Payer: Self-pay

## 2019-01-25 ENCOUNTER — Ambulatory Visit (INDEPENDENT_AMBULATORY_CARE_PROVIDER_SITE_OTHER): Payer: BLUE CROSS/BLUE SHIELD

## 2019-01-25 DIAGNOSIS — R0789 Other chest pain: Secondary | ICD-10-CM

## 2019-01-25 DIAGNOSIS — R079 Chest pain, unspecified: Secondary | ICD-10-CM | POA: Diagnosis not present

## 2019-01-26 NOTE — Progress Notes (Signed)
Repeat chest xray looks ok.  No change compared to previous imaging.

## 2019-03-18 DIAGNOSIS — M9901 Segmental and somatic dysfunction of cervical region: Secondary | ICD-10-CM | POA: Diagnosis not present

## 2019-03-18 DIAGNOSIS — S2341XA Sprain of ribs, initial encounter: Secondary | ICD-10-CM | POA: Diagnosis not present

## 2019-03-18 DIAGNOSIS — R293 Abnormal posture: Secondary | ICD-10-CM | POA: Diagnosis not present

## 2019-03-18 DIAGNOSIS — S233XXA Sprain of ligaments of thoracic spine, initial encounter: Secondary | ICD-10-CM | POA: Diagnosis not present

## 2019-03-24 DIAGNOSIS — S2341XA Sprain of ribs, initial encounter: Secondary | ICD-10-CM | POA: Diagnosis not present

## 2019-03-24 DIAGNOSIS — R293 Abnormal posture: Secondary | ICD-10-CM | POA: Diagnosis not present

## 2019-03-24 DIAGNOSIS — S233XXA Sprain of ligaments of thoracic spine, initial encounter: Secondary | ICD-10-CM | POA: Diagnosis not present

## 2019-03-24 DIAGNOSIS — M9901 Segmental and somatic dysfunction of cervical region: Secondary | ICD-10-CM | POA: Diagnosis not present

## 2019-03-25 DIAGNOSIS — M9901 Segmental and somatic dysfunction of cervical region: Secondary | ICD-10-CM | POA: Diagnosis not present

## 2019-03-25 DIAGNOSIS — S233XXA Sprain of ligaments of thoracic spine, initial encounter: Secondary | ICD-10-CM | POA: Diagnosis not present

## 2019-03-25 DIAGNOSIS — R293 Abnormal posture: Secondary | ICD-10-CM | POA: Diagnosis not present

## 2019-03-25 DIAGNOSIS — S2341XA Sprain of ribs, initial encounter: Secondary | ICD-10-CM | POA: Diagnosis not present

## 2019-03-29 DIAGNOSIS — S2341XA Sprain of ribs, initial encounter: Secondary | ICD-10-CM | POA: Diagnosis not present

## 2019-03-29 DIAGNOSIS — M9901 Segmental and somatic dysfunction of cervical region: Secondary | ICD-10-CM | POA: Diagnosis not present

## 2019-03-29 DIAGNOSIS — R293 Abnormal posture: Secondary | ICD-10-CM | POA: Diagnosis not present

## 2019-03-29 DIAGNOSIS — S233XXA Sprain of ligaments of thoracic spine, initial encounter: Secondary | ICD-10-CM | POA: Diagnosis not present

## 2019-03-31 DIAGNOSIS — S2341XA Sprain of ribs, initial encounter: Secondary | ICD-10-CM | POA: Diagnosis not present

## 2019-03-31 DIAGNOSIS — R293 Abnormal posture: Secondary | ICD-10-CM | POA: Diagnosis not present

## 2019-03-31 DIAGNOSIS — S233XXA Sprain of ligaments of thoracic spine, initial encounter: Secondary | ICD-10-CM | POA: Diagnosis not present

## 2019-03-31 DIAGNOSIS — M9901 Segmental and somatic dysfunction of cervical region: Secondary | ICD-10-CM | POA: Diagnosis not present

## 2019-04-05 DIAGNOSIS — M9901 Segmental and somatic dysfunction of cervical region: Secondary | ICD-10-CM | POA: Diagnosis not present

## 2019-04-05 DIAGNOSIS — S2341XA Sprain of ribs, initial encounter: Secondary | ICD-10-CM | POA: Diagnosis not present

## 2019-04-05 DIAGNOSIS — R293 Abnormal posture: Secondary | ICD-10-CM | POA: Diagnosis not present

## 2019-04-05 DIAGNOSIS — S233XXA Sprain of ligaments of thoracic spine, initial encounter: Secondary | ICD-10-CM | POA: Diagnosis not present

## 2019-04-07 DIAGNOSIS — R293 Abnormal posture: Secondary | ICD-10-CM | POA: Diagnosis not present

## 2019-04-07 DIAGNOSIS — S2341XA Sprain of ribs, initial encounter: Secondary | ICD-10-CM | POA: Diagnosis not present

## 2019-04-07 DIAGNOSIS — S233XXA Sprain of ligaments of thoracic spine, initial encounter: Secondary | ICD-10-CM | POA: Diagnosis not present

## 2019-04-07 DIAGNOSIS — M9901 Segmental and somatic dysfunction of cervical region: Secondary | ICD-10-CM | POA: Diagnosis not present

## 2019-04-13 DIAGNOSIS — S2341XA Sprain of ribs, initial encounter: Secondary | ICD-10-CM | POA: Diagnosis not present

## 2019-04-13 DIAGNOSIS — M9901 Segmental and somatic dysfunction of cervical region: Secondary | ICD-10-CM | POA: Diagnosis not present

## 2019-04-13 DIAGNOSIS — R293 Abnormal posture: Secondary | ICD-10-CM | POA: Diagnosis not present

## 2019-04-13 DIAGNOSIS — S233XXA Sprain of ligaments of thoracic spine, initial encounter: Secondary | ICD-10-CM | POA: Diagnosis not present

## 2019-04-16 DIAGNOSIS — S2341XA Sprain of ribs, initial encounter: Secondary | ICD-10-CM | POA: Diagnosis not present

## 2019-04-16 DIAGNOSIS — R293 Abnormal posture: Secondary | ICD-10-CM | POA: Diagnosis not present

## 2019-04-16 DIAGNOSIS — S233XXA Sprain of ligaments of thoracic spine, initial encounter: Secondary | ICD-10-CM | POA: Diagnosis not present

## 2019-04-16 DIAGNOSIS — M9901 Segmental and somatic dysfunction of cervical region: Secondary | ICD-10-CM | POA: Diagnosis not present

## 2019-04-19 DIAGNOSIS — R293 Abnormal posture: Secondary | ICD-10-CM | POA: Diagnosis not present

## 2019-04-19 DIAGNOSIS — S2341XA Sprain of ribs, initial encounter: Secondary | ICD-10-CM | POA: Diagnosis not present

## 2019-04-19 DIAGNOSIS — M9901 Segmental and somatic dysfunction of cervical region: Secondary | ICD-10-CM | POA: Diagnosis not present

## 2019-04-19 DIAGNOSIS — S233XXA Sprain of ligaments of thoracic spine, initial encounter: Secondary | ICD-10-CM | POA: Diagnosis not present

## 2019-04-21 DIAGNOSIS — R293 Abnormal posture: Secondary | ICD-10-CM | POA: Diagnosis not present

## 2019-04-21 DIAGNOSIS — M9901 Segmental and somatic dysfunction of cervical region: Secondary | ICD-10-CM | POA: Diagnosis not present

## 2019-04-21 DIAGNOSIS — S233XXA Sprain of ligaments of thoracic spine, initial encounter: Secondary | ICD-10-CM | POA: Diagnosis not present

## 2019-04-21 DIAGNOSIS — S2341XA Sprain of ribs, initial encounter: Secondary | ICD-10-CM | POA: Diagnosis not present

## 2019-04-28 DIAGNOSIS — R293 Abnormal posture: Secondary | ICD-10-CM | POA: Diagnosis not present

## 2019-04-28 DIAGNOSIS — S233XXA Sprain of ligaments of thoracic spine, initial encounter: Secondary | ICD-10-CM | POA: Diagnosis not present

## 2019-04-28 DIAGNOSIS — M9901 Segmental and somatic dysfunction of cervical region: Secondary | ICD-10-CM | POA: Diagnosis not present

## 2019-04-28 DIAGNOSIS — S2341XA Sprain of ribs, initial encounter: Secondary | ICD-10-CM | POA: Diagnosis not present

## 2019-05-10 DIAGNOSIS — M9901 Segmental and somatic dysfunction of cervical region: Secondary | ICD-10-CM | POA: Diagnosis not present

## 2019-05-10 DIAGNOSIS — S2341XA Sprain of ribs, initial encounter: Secondary | ICD-10-CM | POA: Diagnosis not present

## 2019-05-10 DIAGNOSIS — S233XXA Sprain of ligaments of thoracic spine, initial encounter: Secondary | ICD-10-CM | POA: Diagnosis not present

## 2019-05-10 DIAGNOSIS — R293 Abnormal posture: Secondary | ICD-10-CM | POA: Diagnosis not present

## 2019-06-01 DIAGNOSIS — R293 Abnormal posture: Secondary | ICD-10-CM | POA: Diagnosis not present

## 2019-06-01 DIAGNOSIS — S2341XA Sprain of ribs, initial encounter: Secondary | ICD-10-CM | POA: Diagnosis not present

## 2019-06-01 DIAGNOSIS — S233XXA Sprain of ligaments of thoracic spine, initial encounter: Secondary | ICD-10-CM | POA: Diagnosis not present

## 2019-06-01 DIAGNOSIS — M9901 Segmental and somatic dysfunction of cervical region: Secondary | ICD-10-CM | POA: Diagnosis not present

## 2019-06-07 DIAGNOSIS — S233XXA Sprain of ligaments of thoracic spine, initial encounter: Secondary | ICD-10-CM | POA: Diagnosis not present

## 2019-06-07 DIAGNOSIS — M9901 Segmental and somatic dysfunction of cervical region: Secondary | ICD-10-CM | POA: Diagnosis not present

## 2019-06-07 DIAGNOSIS — R293 Abnormal posture: Secondary | ICD-10-CM | POA: Diagnosis not present

## 2019-06-07 DIAGNOSIS — S2341XA Sprain of ribs, initial encounter: Secondary | ICD-10-CM | POA: Diagnosis not present

## 2019-06-10 DIAGNOSIS — R293 Abnormal posture: Secondary | ICD-10-CM | POA: Diagnosis not present

## 2019-06-10 DIAGNOSIS — M9901 Segmental and somatic dysfunction of cervical region: Secondary | ICD-10-CM | POA: Diagnosis not present

## 2019-06-10 DIAGNOSIS — S2341XA Sprain of ribs, initial encounter: Secondary | ICD-10-CM | POA: Diagnosis not present

## 2019-06-10 DIAGNOSIS — S233XXA Sprain of ligaments of thoracic spine, initial encounter: Secondary | ICD-10-CM | POA: Diagnosis not present

## 2019-06-14 DIAGNOSIS — S2341XA Sprain of ribs, initial encounter: Secondary | ICD-10-CM | POA: Diagnosis not present

## 2019-06-14 DIAGNOSIS — M9901 Segmental and somatic dysfunction of cervical region: Secondary | ICD-10-CM | POA: Diagnosis not present

## 2019-06-14 DIAGNOSIS — R293 Abnormal posture: Secondary | ICD-10-CM | POA: Diagnosis not present

## 2019-06-14 DIAGNOSIS — S233XXA Sprain of ligaments of thoracic spine, initial encounter: Secondary | ICD-10-CM | POA: Diagnosis not present

## 2019-06-18 DIAGNOSIS — R293 Abnormal posture: Secondary | ICD-10-CM | POA: Diagnosis not present

## 2019-06-18 DIAGNOSIS — S233XXA Sprain of ligaments of thoracic spine, initial encounter: Secondary | ICD-10-CM | POA: Diagnosis not present

## 2019-06-18 DIAGNOSIS — M9901 Segmental and somatic dysfunction of cervical region: Secondary | ICD-10-CM | POA: Diagnosis not present

## 2019-06-18 DIAGNOSIS — S2341XA Sprain of ribs, initial encounter: Secondary | ICD-10-CM | POA: Diagnosis not present

## 2019-06-21 DIAGNOSIS — M9901 Segmental and somatic dysfunction of cervical region: Secondary | ICD-10-CM | POA: Diagnosis not present

## 2019-06-21 DIAGNOSIS — S233XXA Sprain of ligaments of thoracic spine, initial encounter: Secondary | ICD-10-CM | POA: Diagnosis not present

## 2019-06-21 DIAGNOSIS — R293 Abnormal posture: Secondary | ICD-10-CM | POA: Diagnosis not present

## 2019-06-21 DIAGNOSIS — S2341XA Sprain of ribs, initial encounter: Secondary | ICD-10-CM | POA: Diagnosis not present

## 2019-06-24 DIAGNOSIS — S233XXA Sprain of ligaments of thoracic spine, initial encounter: Secondary | ICD-10-CM | POA: Diagnosis not present

## 2019-06-24 DIAGNOSIS — M9901 Segmental and somatic dysfunction of cervical region: Secondary | ICD-10-CM | POA: Diagnosis not present

## 2019-06-24 DIAGNOSIS — S2341XA Sprain of ribs, initial encounter: Secondary | ICD-10-CM | POA: Diagnosis not present

## 2019-06-24 DIAGNOSIS — R293 Abnormal posture: Secondary | ICD-10-CM | POA: Diagnosis not present

## 2019-07-05 DIAGNOSIS — R293 Abnormal posture: Secondary | ICD-10-CM | POA: Diagnosis not present

## 2019-07-05 DIAGNOSIS — M9901 Segmental and somatic dysfunction of cervical region: Secondary | ICD-10-CM | POA: Diagnosis not present

## 2019-07-05 DIAGNOSIS — S233XXA Sprain of ligaments of thoracic spine, initial encounter: Secondary | ICD-10-CM | POA: Diagnosis not present

## 2019-07-05 DIAGNOSIS — S2341XA Sprain of ribs, initial encounter: Secondary | ICD-10-CM | POA: Diagnosis not present

## 2019-08-09 ENCOUNTER — Other Ambulatory Visit: Payer: Self-pay

## 2019-08-09 ENCOUNTER — Encounter: Payer: Self-pay | Admitting: Family Medicine

## 2019-08-09 ENCOUNTER — Ambulatory Visit: Payer: BC Managed Care – PPO | Admitting: Family Medicine

## 2019-08-09 VITALS — BP 118/82 | HR 77 | Temp 98.4°F | Ht 75.0 in | Wt 233.4 lb

## 2019-08-09 DIAGNOSIS — R197 Diarrhea, unspecified: Secondary | ICD-10-CM

## 2019-08-09 DIAGNOSIS — R0789 Other chest pain: Secondary | ICD-10-CM

## 2019-08-09 DIAGNOSIS — Z23 Encounter for immunization: Secondary | ICD-10-CM | POA: Diagnosis not present

## 2019-08-09 DIAGNOSIS — R0781 Pleurodynia: Secondary | ICD-10-CM

## 2019-08-09 MED ORDER — DICYCLOMINE HCL 10 MG PO CAPS: 10.0000 mg | ORAL_CAPSULE | Freq: Three times a day (TID) | ORAL | 3 refills | Status: DC

## 2019-08-09 NOTE — Assessment & Plan Note (Signed)
-  Symptoms seem related to anxiety regarding chronic pain, likely IBS.  Will give trial of bentyl for cramping.

## 2019-08-09 NOTE — Patient Instructions (Signed)
Try heating pad to rib area Try dicyclomine for GI issues.  You should be contacted from orthopedics for appt.

## 2019-08-09 NOTE — Assessment & Plan Note (Signed)
Continues to have chest wall/rib pain.  May benefit from injection/nerve block to this area.  Will refer to Dr. Ernestina Patches at Orthopedics to see if this may be helpful for him.  Discussed if no improvement with this or if Dr. Ernestina Patches doesn't think this would be beneficial we can proceed with MRI of C/T-Spine.

## 2019-08-09 NOTE — Progress Notes (Signed)
[name] - 33 y.o. male MRN [id number]  Date of birth: [birth year]  Subjective Chief Complaint  Patient presents with  . Chest Pain    Pt been having chronic pain in the right side area of his chest.and he's been havinf loose stools.    HPI Garrett Cunningham is a 33 y.o. male here today for follow up of chronic chest wall pain.  He continues to have chest wall pain, located along R side of ribs.  He does also have some GI symptoms including intermittent diarrhea and stomach cramping.  He does admit to anxiety related to his chronic pain.  He has previously had pain on the L side but this improved after seeing a chiropractor.  He denies blood in his stool.  Previous diagnostic imaging includes US abdomen, CT abdomen, MRI lumbar spine.  Labs have been normal other than + Lyme titers.  He was seen by ID and not thought to have Lyme disease.  He also had EGD which was normal.   Most recently he was seen by neurology and recommended to have MRI of cervical and thoracic spine due to mild hyperreflexia however he declined due to cost.    ROS:  A comprehensive ROS was completed and negative except as noted per HPI  No Known Allergies  Past Medical History:  Diagnosis Date  . Myalgia     Past Surgical History:  Procedure Laterality Date  . HERNIA REPAIR Left 2018    Social History   Socioeconomic History  . Marital status: Married    Spouse name: Garrett Cunningham  . Number of children: 1  . Years of education: Not on file  . Highest education level: 12th grade  Occupational History  . Occupation: English as a second language teacher    Comment: [employer]  . Financial resource strain: Not on file  . Food insecurity    Worry: Not on file    Inability: Not on file  . Transportation needs    Medical: Not on file    Non-medical: Not on file  Tobacco Use  . Smoking status: Current Every Day Smoker    Packs/day: 1.00    Years: 13.00    Pack years: 13.00    Types: Cigarettes  . Smokeless tobacco:  Never Used  Substance and Sexual Activity  . Alcohol use: Not Currently    Comment: rarely, quit 03/2018  . Drug use: Yes    Types: Marijuana    Comment: daily  . Sexual activity: Yes  Lifestyle  . Physical activity    Days per week: Not on file    Minutes per session: Not on file  . Stress: Not on file  Relationships  . Social Herbalist on phone: Not on file    Gets together: Not on file    Attends religious service: Not on file    Active member of club or organization: Not on file    Attends meetings of clubs or organizations: Not on file    Relationship status: Not on file  Other Topics Concern  . Not on file  Social History Narrative   Lives home with wife and daughter.  Works for [employer].  Education:  HS/ some college.  Children one.  Caffeine daily.     Family History  Problem Relation Age of Onset  . Lupus Mother   . Other Sister        RSD  . Other Father  hx unknown   . Colon cancer Neg Hx   . Esophageal cancer Neg Hx   . Rectal cancer Neg Hx     Health Maintenance  Topic Date Due  . HIV Screening  01/01/2001  . TETANUS/TDAP  01/01/2005  . INFLUENZA VACCINE  05/22/2019    ----------------------------------------------------------------------------------------------------------------------------------------------------------------------------------------------------------------- Physical Exam BP 118/82   Pulse 77   Temp 98.4 F (36.9 C) (Oral)   Ht 6\' 3"  (1.905 m)   Wt 233 lb 6.4 oz (105.9 kg)   SpO2 98%   BMI 29.17 kg/m   Physical Exam Constitutional:      Appearance: Normal appearance.  HENT:     Head: Normocephalic and atraumatic.     Mouth/Throat:     Mouth: Mucous membranes are moist.  Eyes:     General: No scleral icterus. Neck:     Musculoskeletal: Neck supple.  Cardiovascular:     Rate and Rhythm: Normal rate and regular rhythm.  Pulmonary:     Effort: Pulmonary effort is normal.     Breath  sounds: Normal breath sounds.  Chest:     Chest wall: Tenderness (ttp along R lower ribs.  Pain with movement and palpation. ) present.  Skin:    General: Skin is warm and dry.     Findings: No rash.  Neurological:     General: No focal deficit present.     Mental Status: He is alert.  Psychiatric:        Mood and Affect: Mood normal.        Behavior: Behavior normal.     ------------------------------------------------------------------------------------------------------------------------------------------------------------------------------------------------------------------- Assessment and Plan  Chest wall pain Continues to have chest wall/rib pain.  May benefit from injection/nerve block to this area.  Will refer to Dr. at Orthopedics to see if this may be helpful for him.  Discussed if no improvement with this or if Dr. Alvester Morin doesn't think this would be beneficial we can proceed with MRI of C/T-Spine.   Diarrhea -Symptoms seem related to anxiety regarding chronic pain, likely IBS.  Will give trial of bentyl for cramping.

## 2019-08-20 ENCOUNTER — Ambulatory Visit (INDEPENDENT_AMBULATORY_CARE_PROVIDER_SITE_OTHER): Payer: BC Managed Care – PPO | Admitting: Family Medicine

## 2019-08-20 ENCOUNTER — Encounter: Payer: Self-pay | Admitting: Family Medicine

## 2019-08-20 DIAGNOSIS — R0789 Other chest pain: Secondary | ICD-10-CM | POA: Diagnosis not present

## 2019-08-20 MED ORDER — VITAMIN D-3 125 MCG (5000 UT) PO TABS: ORAL_TABLET | ORAL | 3 refills | Status: DC

## 2019-08-20 MED ORDER — VSL#3 PO CAPS: 1.0000 | ORAL_CAPSULE | Freq: Every day | ORAL | 3 refills | Status: DC

## 2019-08-20 MED ORDER — NABUMETONE 750 MG PO TABS: 750.0000 mg | ORAL_TABLET | Freq: Two times a day (BID) | ORAL | 6 refills | Status: DC | PRN

## 2019-08-20 MED ORDER — BACLOFEN 10 MG PO TABS: 5.0000 mg | ORAL_TABLET | Freq: Three times a day (TID) | ORAL | 3 refills | Status: DC | PRN

## 2019-08-20 NOTE — Progress Notes (Signed)
Office Visit Note   Patient: Garrett Cunningham           Date of Birth: 06-06-1986           MRN: [id number] Visit Date: 08/20/2019 Requested by: Luetta Nutting, DO [place],  [place] [address] PCP: Luetta Nutting, DO  Subjective: Chief Complaint  Patient presents with  . shooting pains throughout torso since hernia sx 2 yrs ago    HPI: He is here with chest Pain.  2 years ago he had left inguinal herniorrhaphy.  Shortly after he started having pains in his left side.  After couple months he went back to his surgeon who felt that everything seemed to be healing appropriately from a surgical standpoint.  He continued to have pain so he ultimately had abdominal ultrasound which was normal, pelvic CT scan which was normal and a lumbar MRI scan which was normal.  He underwent testing for Lyme disease which initially was positive but subsequent testing months later was negative so it was presumed to be a false positive test.  No treatment was given for this.  He has tried gabapentin with no improvement.  He was given Bentyl which has not helped.  He has pain in the left rib cage area, right upper quadrant abdomen, and in the right pectoralis area.  The pain is affecting his quality of life.  He has trouble sleeping at night.  He has trouble doing normal activities with his wife and child.  His PCP referred him to Dr. Ernestina Patches for possible nerve block.  I think he might have been scheduled with me inadvertently.  In the past 6 months he has developed diarrhea.  He is not sure whether it is related, but was wondering if it was related to pain.              ROS: No fevers or chills.  All other systems were reviewed and are negative.  Objective: Vital Signs: There were no vitals taken for this visit.  Physical Exam:  General:  Alert and oriented, in no acute distress. Pulm:  Breathing unlabored. Psy:  Normal mood, congruent affect. Skin: No rash on the skin. Back: He has  several tender trigger points, especially on the right rhomboid area.  No tenderness over the spinous processes. Chest: Very tender to palpation in the left pectoralis area, especially over the ribs. Abdomen: No hepatosplenomegaly.  He has pain in the right upper quadrant, positive Murphy sign.   Imaging: None today.  Assessment & Plan: 1.  Chronic chest wall pain status post inguinal hernia repair -Agree with referral to Dr. Ernestina Patches for potential nerve block so I will arrange this for him. -We will empirically treat with vitamin D3.  We will try probiotics as well.  Prescriptions for baclofen and Relafen to take as needed. -Consider additional work-up if he fails to improve, possible thoracic MRI scan.     Procedures: No procedures performed  No notes on file     PMFS History: Patient Active Problem List   Diagnosis Date Noted  . Diarrhea 08/09/2019  . Chest wall pain 09/14/2018  . Cough 09/14/2018  . Nicotine dependence, cigarettes, uncomplicated 69/[id number]/[id number]  . Viral upper respiratory tract infection 07/24/2018  . Epigastric pain 06/04/2018  . Chronic pain syndrome 05/11/2018   Past Medical History:  Diagnosis Date  . Myalgia     Family History  Problem Relation Age of Onset  . Lupus Mother   . Other Sister  RSD  . Other Father        hx unknown   . Colon cancer Neg Hx   . Esophageal cancer Neg Hx   . Rectal cancer Neg Hx     Past Surgical History:  Procedure Laterality Date  . HERNIA REPAIR Left 2018   Social History   Occupational History  . Occupation: Civil Service fast streamer    Comment: [employer]  Tobacco Use  . Smoking status: Current Every Day Smoker    Packs/day: 1.00    Years: 13.00    Pack years: 13.00    Types: Cigarettes  . Smokeless tobacco: Never Used  Substance and Sexual Activity  . Alcohol use: Not Currently    Comment: rarely, quit 03/2018  . Drug use: Yes    Types: Marijuana    Comment: daily  . Sexual activity: Yes

## 2019-09-14 ENCOUNTER — Other Ambulatory Visit: Payer: Self-pay

## 2019-09-14 ENCOUNTER — Encounter: Payer: Self-pay | Admitting: Physical Medicine and Rehabilitation

## 2019-09-14 ENCOUNTER — Ambulatory Visit: Payer: BC Managed Care – PPO | Admitting: Physical Medicine and Rehabilitation

## 2019-09-14 VITALS — BP 133/85 | HR 66

## 2019-09-14 DIAGNOSIS — M546 Pain in thoracic spine: Secondary | ICD-10-CM | POA: Diagnosis not present

## 2019-09-14 DIAGNOSIS — R1013 Epigastric pain: Secondary | ICD-10-CM | POA: Diagnosis not present

## 2019-09-14 DIAGNOSIS — G894 Chronic pain syndrome: Secondary | ICD-10-CM

## 2019-09-14 DIAGNOSIS — R0789 Other chest pain: Secondary | ICD-10-CM | POA: Diagnosis not present

## 2019-09-14 DIAGNOSIS — G8929 Other chronic pain: Secondary | ICD-10-CM

## 2019-09-14 NOTE — Progress Notes (Signed)
Garrett Cunningham - 33 y.o. male MRN [id number]  Date of birth: August 13, 1986  Office Visit Note: Visit Date: 09/14/2019 PCP: Everrett CoombeMatthews, Cody, DO Referred by: Everrett CoombeMatthews, Cody, DO  Subjective: Chief Complaint  Patient presents with   Middle Back - Pain   Chest - Pain   HPI: Garrett SayersJonathan A Commins is a 33 y.o. male who comes in today At the request of Dr. Everrett Coombeody Matthews and Dr. Casimiro NeedleMichael hilts for evaluation management of chronic right chest pain and chest wall pain as well as right thoracic pain.  Approximately 2 years ago the patient had a left inguinal herniorrhaphy.  He reports continued pain in the inguinal area radiating up into the lower back area and somewhat up even into the flank.  He had several studies done including pelvic CT scans and ultimately lumbar spine MRI and abdominal ultrasound which were negative.  This was followed from his general surgeon who did the hernia repair.  Ultimately that started to feel somewhat better but then he was getting pain across to the right side more on the right side of the thoracic spine really at about the mid thoracic area with referral at times to the front right underneath the nipple line.  The pain on the left side seem to diminish greatly over time but then he was having this pain on the right.  During that time it was felt like may be he had an issue causing multiple myalgias and he underwent blood test for Lyme disease.  He ended up seeing an infectious disease specialist and this was deemed probably a false positive test.  He has had various medications including Bentyl and gabapentin without relief.  He recently saw Dr. Prince RomeHilts who prescribed Relafen as well as baclofen.  Patient states the baclofen has been seemingly helping.  He has been seeing a chiropractor over the last year he sees them now once every 2 weeks.  Chiropractor does manipulations along the rib cage.  He does report this affects his quality of life and is not sleeping well.  He does not  carry a diagnosis of anxiety.  He has not had MRI of the cervical or thoracic spine.  I did review images of the CT scan of his pelvis and the lumbar MRI which is fairly normal.  He also saw a neurologist Dr. Marjory LiesPenumalli who suggested MRI of the thoracic spine.  Patient has not had specific physical therapy.  He has had no spinal interventions or nerve blocks etc.  He does try to exercise some but he really does not do any strength training activities and this is really been curtailed since the hernia surgery and ongoing pain complaints.    Review of Systems  Constitutional: Negative for chills, fever, malaise/fatigue and weight loss.  HENT: Negative for hearing loss and sinus pain.   Eyes: Negative for blurred vision, double vision and photophobia.  Respiratory: Negative for cough and shortness of breath.   Cardiovascular: Negative for chest pain, palpitations and leg swelling.  Gastrointestinal: Negative for abdominal pain, nausea and vomiting.  Genitourinary: Negative for flank pain.  Musculoskeletal: Positive for back pain. Negative for myalgias.       Right lower chest wall pain  Skin: Negative for itching and rash.  Neurological: Negative for tremors, focal weakness and weakness.  Endo/Heme/Allergies: Negative.   Psychiatric/Behavioral: Negative for depression.  All other systems reviewed and are negative.  Otherwise per HPI.  Assessment & Plan: Visit Diagnoses:  1. Chest wall pain  2. Epigastric pain   3. Chronic pain syndrome   4. Chronic right-sided thoracic back pain     Plan: Findings:  Chronic history of migrating type pain status post hernia surgery a couple years ago.  I think he is trying to piece together multiple events which may not actually go together.  I think there may be some underlying anxiety issues.  He clearly has focal trigger points on exam particularly in the infraspinatus rhomboids paraspinal region.  These do reproduce some of his pain particularly in the  back.  At this point I think the best course of action is for him to continue the Relafen at least short-term and then also continue the baclofen but I want to get him into physical therapy for a few sessions particularly of dry needling and manual treatment to see if this alleviates his pain further.  He has gotten some relief with chiropractic manipulation.  If he becomes stagnant or this does not help would consider possible intercostal nerve block versus thoracic spine MRI.  Alternative diagnosis.  His anterior pain could be costochondritis but his pain is really more over to the lateral margins of the lower ribs.    Meds & Orders: No orders of the defined types were placed in this encounter.   Orders Placed This Encounter  Procedures   Ambulatory referral to Physical Therapy    Follow-up: Return in about 4 weeks (around 10/12/2019).   Procedures: No procedures performed  No notes on file   Clinical History: MRI LUMBAR SPINE WITHOUT CONTRAST  TECHNIQUE: Multiplanar, multisequence MR imaging of the lumbar spine was performed. No intravenous contrast was administered.  COMPARISON: None.  FINDINGS: Segmentation: Normal  Alignment: Normal  Vertebrae: Normal bone marrow. Negative for fracture or mass.  Conus medullaris and cauda equina: Conus extends to the L1-2 level. Conus and cauda equina appear normal.  Paraspinal and other soft tissues: No paraspinous soft tissue mass or fluid collection  Disc levels:  L1-2: Negative  L2-3: Negative  L3-4: Negative  L4-5: Mild disc bulging and mild facet degeneration. Negative for stenosis.  L5-S1: Mild disc degeneration. Small central disc protrusion with small central annular fissure. Negative for neural impingement or stenosis.  IMPRESSION: Small central disc protrusion L5-S1 with small central annular fissure. Negative for neural impingement or stenosis.   Electronically Signed By: Marlan Palau  M.D. On: 03/19/2018 07:03 ---- CLINICAL DATA:  Chronic chest pain  EXAM: CHEST - 2 VIEW  COMPARISON:  09/14/2018  FINDINGS: The heart size and mediastinal contours are within normal limits. Both lungs are clear. The visualized skeletal structures are unremarkable.  IMPRESSION: No active cardiopulmonary disease.   Electronically Signed   By: Elige Ko   On: 01/25/2019 08:41   He reports that he has been smoking cigarettes. He has a 13.00 pack-year smoking history. He has never used smokeless tobacco.  Recent Labs    10/27/18 0934  HGBA1C 5.2    Objective:  VS:  HT:     WT:    BMI:      BP:133/85   HR:66bpm   TEMP: ( )   RESP:  Physical Exam Vitals signs and nursing note reviewed.  Constitutional:      General: He is not in acute distress.    Appearance: He is well-developed.  HENT:     Head: Normocephalic and atraumatic.     Nose: Nose normal.     Mouth/Throat:     Mouth: Mucous membranes are moist.  Pharynx: Oropharynx is clear.  Eyes:     Conjunctiva/sclera: Conjunctivae normal.     Pupils: Pupils are equal, round, and reactive to light.  Neck:     Musculoskeletal: Normal range of motion and neck supple.     Trachea: No tracheal deviation.  Cardiovascular:     Rate and Rhythm: Normal rate and regular rhythm.     Pulses: Normal pulses.  Pulmonary:     Effort: Pulmonary effort is normal.     Breath sounds: Normal breath sounds.  Abdominal:     General: There is no distension.     Palpations: Abdomen is soft.     Tenderness: There is no guarding or rebound.  Musculoskeletal:        General: No deformity.     Right lower leg: No edema.     Left lower leg: No edema.     Comments: Patient ambulates without aid with normal gait.  He has good distal strength without clonus.  Palpating down the spine from the C7 spinous process there is no real pain with rocking of the vertebral bodies.  He does have paraspinal taut bands and trigger points he has  trigger points in the right rhomboid and infraspinatus musculature that really seem to give him a lot of pain with palpation.  Examining the front chest wall he has pain more along the lower 11th and 12th rib.  This can spread however but he does not really have tenderness in the musculature on the front.  Skin:    General: Skin is warm and dry.     Findings: No erythema or rash.  Neurological:     General: No focal deficit present.     Mental Status: He is alert and oriented to person, place, and time.     Motor: No abnormal muscle tone.     Coordination: Coordination normal.     Gait: Gait normal.  Psychiatric:        Mood and Affect: Mood normal.        Behavior: Behavior normal.        Thought Content: Thought content normal.     Ortho Exam Imaging: No results found.  Past Medical/Family/Surgical/Social History: Medications & Allergies reviewed per EMR, new medications updated. Patient Active Problem List   Diagnosis Date Noted   Diarrhea 08/09/2019   Chest wall pain 09/14/2018   Cough 09/14/2018   Nicotine dependence, cigarettes, uncomplicated 51/76/1607   Viral upper respiratory tract infection 07/24/2018   Epigastric pain 06/04/2018   Chronic pain syndrome 05/11/2018   Past Medical History:  Diagnosis Date   Myalgia    Family History  Problem Relation Age of Onset   Lupus Mother    Other Sister        RSD   Other Father        hx unknown    Colon cancer Neg Hx    Esophageal cancer Neg Hx    Rectal cancer Neg Hx    Past Surgical History:  Procedure Laterality Date   HERNIA REPAIR Left 2018   Social History   Occupational History   Occupation: English as a second language teacher    Comment: [employer]  Tobacco Use   Smoking status: Current Every Day Smoker    Packs/day: 1.00    Years: 13.00    Pack years: 13.00    Types: Cigarettes   Smokeless tobacco: Never Used  Substance and Sexual Activity   Alcohol use: Not Currently    Comment: rarely, quit  03/2018   Drug use: Yes    Types: Marijuana    Comment: daily   Sexual activity: Yes

## 2019-09-14 NOTE — Progress Notes (Signed)
  Numeric Pain Rating Scale and Functional Assessment Average Pain 8 Pain Right Now 4 My pain is intermittent, sharp, dull and aching Pain is worse with: unsure Pain improves with: medication   In the last MONTH (on 0-10 scale) has pain interfered with the following?  1. General activity like being  able to carry out your everyday physical activities such as walking, climbing stairs, carrying groceries, or moving a chair?  Rating(10)  2. Relation with others like being able to carry out your usual social activities and roles such as  activities at home, at work and in your community. Rating(10)  3. Enjoyment of life such that you have  been bothered by emotional problems such as feeling anxious, depressed or irritable?  Rating(10)

## 2019-09-15 ENCOUNTER — Encounter: Payer: Self-pay | Admitting: Physical Medicine and Rehabilitation

## 2019-10-01 ENCOUNTER — Other Ambulatory Visit: Payer: Self-pay

## 2019-10-01 ENCOUNTER — Ambulatory Visit (INDEPENDENT_AMBULATORY_CARE_PROVIDER_SITE_OTHER): Payer: BC Managed Care – PPO | Admitting: Physical Therapy

## 2019-10-01 DIAGNOSIS — R252 Cramp and spasm: Secondary | ICD-10-CM | POA: Diagnosis not present

## 2019-10-01 DIAGNOSIS — R29898 Other symptoms and signs involving the musculoskeletal system: Secondary | ICD-10-CM

## 2019-10-01 DIAGNOSIS — M546 Pain in thoracic spine: Secondary | ICD-10-CM

## 2019-10-01 NOTE — Therapy (Signed)
[facility] Physical Therapy [address] [place], [place], [address] Phone: [phone]   Fax:  [phone]  Physical Therapy Evaluation  Patient Details  Name: Garrett Cunningham MRN: [id number] Date of Birth: 02-01-1986 Referring Provider (PT): Tyrell Antonio, MD   Encounter Date: 10/01/2019  PT End of Session - 10/01/19 0904    Visit Number  1    Number of Visits  6    Date for PT Re-Evaluation  11/12/19    PT Start Time  0808    PT Stop Time  0843    PT Time Calculation (min)  35 min    Activity Tolerance  Patient tolerated treatment well    Behavior During Therapy  [facility] for tasks assessed/performed       Past Medical History:  Diagnosis Date  . Myalgia     Past Surgical History:  Procedure Laterality Date  . HERNIA REPAIR Left 2018    There were no vitals filed for this visit.   Subjective Assessment - 10/01/19 0809    Subjective  Pt is a 33 y/o male who presents to OPPT for variable torso pain x 9-12 months.  Pt feels symptoms started following hernia surgery, and describes symptoms as a "spear going through my chest/back."  Pt unable to identify triggers for pain.  Pt has had numerous procedures/lab work without being able to identify cause of pain.  MD dx with myofascial pain syndrome.    Patient Stated Goals  improve pain    Currently in Pain?  Yes    Pain Score  3    up to 10/10; at best 1/10   Pain Location  Chest   "through to back"   Pain Orientation  Right    Pain Descriptors / Indicators  Aching;Sharp;Stabbing    Pain Type  Chronic pain    Pain Onset  More than a month ago    Pain Frequency  Constant    Aggravating Factors   unknown triggers    Pain Relieving Factors  nothing         [facility] PT Assessment - 10/01/19 0813      Assessment   Medical Diagnosis  myofascial pain    Referring Provider (PT)  Tyrell Antonio, MD    Onset Date/Surgical Date  --   July 2018   Hand Dominance  Right    Next MD Visit  PRN    Prior Therapy  none; chiropractor      Precautions   Precautions  None      Restrictions   Weight Bearing Restrictions  No      Balance Screen   Has the patient fallen in the past 6 months  No    Has the patient had a decrease in activity level because of a fear of falling?   No    Is the patient reluctant to leave their home because of a fear of falling?   No      Home Environment   Living Environment  Private residence    Living Arrangements  Spouse/significant other;Children   104 y/o daughter, dogs     Prior Function   Level of Independence  Independent    Vocation  Full time employment    Forensic scientist; driving/computer work    Leisure  woodworking; hiking; walking for exercise - hasn't done much exercise since pain started      Cognition   Overall Cognitive Status  Within Functional Limits for tasks  assessed      Posture/Postural Control   Posture/Postural Control  Postural limitations    Postural Limitations  Rounded Shoulders;Forward head      ROM / Strength   AROM / PROM / Strength  AROM      AROM   Overall AROM Comments  pain/tightness noted with cervical flexion/rotation, mild tightness with Rt shoulder ROM      Palpation   Palpation comment  active trigger points noted Rt rhomboids and distal subscapularis; upper trap and levator trigger points - not as active                Objective measurements completed on examination: See above findings.      [facility] Adult PT Treatment/Exercise - 10/01/19 0813      Manual Therapy   Manual Therapy  Soft tissue mobilization    Soft tissue mobilization  Rt distal subscap and rhomboids       Trigger Point Dry Needling - 10/01/19 [id number]    Consent Given?  Yes    Education Handout Provided  No   will give next session, verbally reviewed   Muscles Treated Upper Quadrant  Rhomboids;Subscapularis    Rhomboids Response  Twitch response elicited;Palpable increased muscle length     Subscapularis Response  Twitch response elicited;Palpable increased muscle length   distal          PT Education - 10/01/19 0904    Education Details  DN    Person(s) Educated  Patient    Methods  Explanation;Demonstration    Comprehension  Verbalized understanding          PT Long Term Goals - 10/01/19 0907      PT LONG TERM GOAL #1   Title  independent with HEP    Status  New    Target Date  11/12/19      PT LONG TERM GOAL #2   Title  demonstrate ability to perform cervical ROM without increase in pain for improved function    Status  New    Target Date  11/12/19      PT LONG TERM GOAL #3   Title  report 75% improvement in pain for improved function    Status  New    Target Date  11/12/19             Plan - 10/01/19 0905    Clinical Impression Statement  Pt is a 33 y/o male who presents to OPPT for myofascial pain x 12 months.  Pt with active trigger points affecting motion and function.  Pt will benefit from PT to address deficits listed.    Personal Factors and Comorbidities  Comorbidity 1    Comorbidities  Hernia repair 2018    Examination-Activity Limitations  Reach Overhead;Sleep    Examination-Participation Restrictions  Other   occupation   Stability/Clinical Decision Making  Stable/Uncomplicated    Clinical Decision Making  Low    Rehab Potential  Good    PT Frequency  1x / week    PT Duration  6 weeks    PT Treatment/Interventions  ADLs/Self Care Home Management;Cryotherapy;Electrical Stimulation;Ultrasound;Moist Heat;Therapeutic exercise;Therapeutic activities;Patient/family education;Neuromuscular re-education;Manual techniques;Taping;Dry needling    PT Next Visit Plan  assess response to DN, continue manual therapy/DN PRN, HEP for strengthening would be helpful    Consulted and Agree with Plan of Care  Patient       Patient will benefit from skilled therapeutic intervention in order to improve the following deficits and impairments:  Increased fascial restricitons, Increased muscle spasms, Impaired flexibility, Pain, Postural dysfunction  Visit Diagnosis: Cramp and spasm - Plan: PT plan of care cert/re-cert  Other symptoms and signs involving the musculoskeletal system - Plan: PT plan of care cert/re-cert  Pain in thoracic spine - Plan: PT plan of care cert/re-cert     Problem List Patient Active Problem List   Diagnosis Date Noted  . Diarrhea 08/09/2019  . Chest wall pain 09/14/2018  . Cough 09/14/2018  . Nicotine dependence, cigarettes, uncomplicated 79/11/4095  . Viral upper respiratory tract infection 07/24/2018  . Epigastric pain 06/04/2018  . Chronic pain syndrome 05/11/2018      Laureen Abrahams, PT, DPT 10/01/19 9:10 AM     [facility] Therapy [address] [place], [place], [address] Phone: [phone]   Fax:  [phone]  Name: Garrett Cunningham MRN: [id number] Date of Birth: 09-06-1986

## 2019-10-07 ENCOUNTER — Other Ambulatory Visit: Payer: Self-pay

## 2019-10-07 ENCOUNTER — Ambulatory Visit (INDEPENDENT_AMBULATORY_CARE_PROVIDER_SITE_OTHER): Payer: BC Managed Care – PPO | Admitting: Physical Therapy

## 2019-10-07 ENCOUNTER — Encounter: Payer: Self-pay | Admitting: Physical Therapy

## 2019-10-07 DIAGNOSIS — M546 Pain in thoracic spine: Secondary | ICD-10-CM | POA: Diagnosis not present

## 2019-10-07 DIAGNOSIS — R252 Cramp and spasm: Secondary | ICD-10-CM | POA: Diagnosis not present

## 2019-10-07 DIAGNOSIS — R29898 Other symptoms and signs involving the musculoskeletal system: Secondary | ICD-10-CM | POA: Diagnosis not present

## 2019-10-07 NOTE — Patient Instructions (Signed)
Access Code: [email or web address]  URL: [email or web address]  Date: 10/07/2019  Prepared by: Faustino Congress   Exercises Doorway Pec Stretch at 90 Degrees Abduction - 3 reps - 1 sets - 30 sec hold - 1x daily - 7x weekly Doorway Pec Stretch at 120 Degrees Abduction - 3 reps - 1 sets - 30 sec hold - 1x daily - 7x weekly Chest and Bicep Stretch - Arms Behind Back - 3 reps - 1 sets - 30 sec hold - 1x daily - 7x weekly Patient Education Trigger Point Dry Needling

## 2019-10-07 NOTE — Therapy (Addendum)
[facility] [address] [place], [place], [address] Phone: [phone]   Fax:  [phone]  Physical Therapy Treatment/Discharge Summary  Patient Details  Name: Garrett Cunningham MRN: [id number] Date of Birth: 02/08/1986 Referring Provider (PT): Newton, Frederic, MD   Encounter Date: 10/07/2019  PT End of Session - 10/07/19 1048    Visit Number  2    Number of Visits  6    Date for PT Re-Evaluation  11/12/19    PT Start Time  1012    PT Stop Time  1045    PT Time Calculation (min)  33 min    Activity Tolerance  Patient tolerated treatment well    Behavior During Therapy  WFL for tasks assessed/performed       Past Medical History:  Diagnosis Date  . Myalgia     Past Surgical History:  Procedure Laterality Date  . HERNIA REPAIR Left 2018    There were no vitals filed for this visit.  Subjective Assessment - 10/07/19 1013    Subjective  feeling much better overall.  recently has felt a faint pain but overall improved.    Patient Stated Goals  improve pain    Currently in Pain?  Yes    Pain Score  1     Pain Location  Chest    Pain Orientation  Right    Pain Descriptors / Indicators  Aching;Sharp;Stabbing    Pain Type  Chronic pain    Pain Onset  More than a month ago    Pain Frequency  Constant    Aggravating Factors   unknown triggers    Pain Relieving Factors  DN                       [facility] Adult PT Treatment/Exercise - 10/07/19 1047      Exercises   Exercises  Neck      Manual Therapy   Soft tissue mobilization  Rt rhomboids and pecs      Neck Exercises: Stretches   Chest Stretch  1 rep;30 seconds   hands behind back   Other Neck Stretches  low/mid/high doorway stretch x 30 sec each       Trigger Point Dry Needling - 10/07/19 1047    Consent Given?  Yes    Education Handout Provided  Yes    Muscles Treated Upper Quadrant  Rhomboids;Subscapularis;Pectoralis major;Pectoralis minor    Pectoralis Major Response  Twitch response elicited;Palpable increased muscle length    Pectoralis Minor Response  Twitch response elicited;Palpable increased muscle length    Rhomboids Response  Twitch response elicited;Palpable increased muscle length    Subscapularis Response  Twitch response elicited;Palpable increased muscle length   proximal               PT Long Term Goals - 10/01/19 0907      PT LONG TERM GOAL #1   Title  independent with HEP    Status  New    Target Date  11/12/19      PT LONG TERM GOAL #2   Title  demonstrate ability to perform cervical ROM without increase in pain for improved function    Status  New    Target Date  11/12/19      PT LONG TERM GOAL #3   Title  report 75% improvement in pain for improved function    Status  New    Target Date  11/12/19              Plan - 10/07/19 1046    Clinical Impression Statement  Pt reports pain now decrease significantly and reports closer to 1/10.  Pt has met LTG #3 but want to ensure carryover at this time.  Overall progressing well with PT.    Personal Factors and Comorbidities  Comorbidity 1    Comorbidities  Hernia repair 2018    Examination-Activity Limitations  Reach Overhead;Sleep    Examination-Participation Restrictions  Other   occupation   Stability/Clinical Decision Making  Stable/Uncomplicated    Rehab Potential  Good    PT Frequency  1x / week    PT Duration  6 weeks    PT Treatment/Interventions  ADLs/Self Care Home Management;Cryotherapy;Electrical Stimulation;Ultrasound;Moist Heat;Therapeutic exercise;Therapeutic activities;Patient/family education;Neuromuscular re-education;Manual techniques;Taping;Dry needling    PT Next Visit Plan  follow up PRN for DN, review HEP PRN    PT Home Exercise Plan  Access Code: [email or web address]    Consulted and Agree with Plan of Care  Patient       Patient will benefit from skilled therapeutic intervention in order to improve the following deficits and  impairments:  Increased fascial restricitons, Increased muscle spasms, Impaired flexibility, Pain, Postural dysfunction  Visit Diagnosis: Cramp and spasm  Other symptoms and signs involving the musculoskeletal system  Pain in thoracic spine     Problem List Patient Active Problem List   Diagnosis Date Noted  . Diarrhea 08/09/2019  . Chest wall pain 09/14/2018  . Cough 09/14/2018  . Nicotine dependence, cigarettes, uncomplicated 09/14/2018  . Viral upper respiratory tract infection 07/24/2018  . Epigastric pain 06/04/2018  . Chronic pain syndrome 05/11/2018      Stephanie F Matthews, PT, DPT 10/07/19 10:51 AM      [facility] [address] [place], [place], [address] Phone: [phone]   Fax:  [phone]  Name: Garrett Cunningham MRN: [id number] Date of Birth: 06/14/1986     PHYSICAL THERAPY DISCHARGE SUMMARY  Visits from Start of Care: 2  Current functional level related to goals / functional outcomes: See above   Remaining deficits: See above   Education / Equipment: HEP, DN  Plan: Patient agrees to discharge.  Patient goals were not met. Patient is being discharged due to a change in medical status.  ?????    Stephanie F Matthews, PT, DPT 03/17/20 10:31 AM  [facility] [address] [place], [place], [address] Phone: [phone]   Fax:  [phone]    

## 2019-10-12 ENCOUNTER — Ambulatory Visit: Payer: BC Managed Care – PPO | Admitting: Physical Medicine and Rehabilitation

## 2019-10-13 ENCOUNTER — Encounter: Payer: Self-pay | Admitting: Family Medicine

## 2019-10-13 ENCOUNTER — Encounter: Payer: Self-pay | Admitting: Physical Medicine and Rehabilitation

## 2019-11-03 ENCOUNTER — Encounter: Payer: Self-pay | Admitting: Physical Medicine and Rehabilitation

## 2019-11-03 ENCOUNTER — Encounter: Payer: Self-pay | Admitting: Physical Therapy

## 2019-11-03 DIAGNOSIS — G8929 Other chronic pain: Secondary | ICD-10-CM

## 2019-11-05 ENCOUNTER — Encounter: Payer: BC Managed Care – PPO | Admitting: Physical Therapy

## 2019-11-17 ENCOUNTER — Telehealth: Payer: Self-pay | Admitting: Physical Medicine and Rehabilitation

## 2019-11-17 NOTE — Telephone Encounter (Signed)
Patient's MRI has been scheduled for 11/24/19. Left message for patient to call back to schedule MRI review with Dr. Alvester Morin.

## 2019-11-24 ENCOUNTER — Other Ambulatory Visit: Payer: Self-pay

## 2019-11-24 ENCOUNTER — Ambulatory Visit
Admission: RE | Admit: 2019-11-24 | Discharge: 2019-11-24 | Disposition: A | Discharge status: Home / Self Care | Payer: BC Managed Care – PPO | Source: Ambulatory Visit | Attending: Physical Medicine and Rehabilitation | Admitting: Physical Medicine and Rehabilitation

## 2019-11-24 DIAGNOSIS — M5124 Other intervertebral disc displacement, thoracic region: Secondary | ICD-10-CM | POA: Diagnosis not present

## 2019-11-24 DIAGNOSIS — G8929 Other chronic pain: Secondary | ICD-10-CM

## 2019-11-24 DIAGNOSIS — M546 Pain in thoracic spine: Secondary | ICD-10-CM

## 2019-12-21 ENCOUNTER — Other Ambulatory Visit: Payer: Self-pay

## 2019-12-21 ENCOUNTER — Encounter: Payer: Self-pay | Admitting: Physical Medicine and Rehabilitation

## 2019-12-21 ENCOUNTER — Ambulatory Visit (INDEPENDENT_AMBULATORY_CARE_PROVIDER_SITE_OTHER): Payer: BC Managed Care – PPO | Admitting: Physical Medicine and Rehabilitation

## 2019-12-21 VITALS — BP 131/80 | HR 80 | Ht 75.0 in | Wt 220.0 lb

## 2019-12-21 DIAGNOSIS — M7918 Myalgia, other site: Secondary | ICD-10-CM

## 2019-12-21 DIAGNOSIS — R0789 Other chest pain: Secondary | ICD-10-CM | POA: Diagnosis not present

## 2019-12-21 DIAGNOSIS — G894 Chronic pain syndrome: Secondary | ICD-10-CM

## 2019-12-21 DIAGNOSIS — R1013 Epigastric pain: Secondary | ICD-10-CM

## 2019-12-21 DIAGNOSIS — M546 Pain in thoracic spine: Secondary | ICD-10-CM

## 2019-12-21 NOTE — Progress Notes (Signed)
 .  Numeric Pain Rating Scale and Functional Assessment Average Pain 6 Pain Right Now 7 My pain is constant, sharp and dull Pain is worse with: walking, bending and some activites Pain improves with: rest   In the last MONTH (on 0-10 scale) has pain interfered with the following?  1. General activity like being  able to carry out your everyday physical activities such as walking, climbing stairs, carrying groceries, or moving a chair?  Rating(8)  2. Relation with others like being able to carry out your usual social activities and roles such as  activities at home, at work and in your community. Rating(8)  3. Enjoyment of life such that you have  been bothered by emotional problems such as feeling anxious, depressed or irritable?  Rating(5)

## 2019-12-22 ENCOUNTER — Encounter: Payer: Self-pay | Admitting: Physical Medicine and Rehabilitation

## 2019-12-22 NOTE — Progress Notes (Signed)
Garrett Cunningham - 33 y.o. male MRN [id number]  Date of birth: [birth year]  Office Visit Note: Visit Date: 12/21/2019 PCP: Everrett Coombe, DO Referred by: Everrett Coombe, DO  Subjective: Chief Complaint  Patient presents with  . Middle Back - Pain  . Chest - Pain   HPI: Garrett Cunningham is a 34 y.o. male who comes in today For evaluation management of continued right thoracic upper back pain as well as epigastric or chest wall pain on the right.  This represents a chronic situation with a recent several month history of exacerbation.  Since I have seen the patient he has had physical therapy with dry needling.  He is also had thoracic MRI.  He reports continued 6 out of 10 pain on average constant sharp sometimes dull.  Significant pain worse with sneezing but not coughing.  Worse with walking and bending.  Rates his pain is really debilitating at this point.  He is 34 years old and otherwise good healthy condition.  He has had some GI work-up with upper endoscopy and maybe ultrasound.  He is not very sure if they have looked totally at his gallbladder but he does have a history in the family of gallbladder issues.  He relates also chronic diarrhea but he does not know if it is related to the problem.  He has had work-up for chest pain as well.  By way of review Dr. Prince Rome in the office first saw him as I was out of the office and suggested intercostal blocks.  We saw him after that and decided this may be was more myofascial pain or maybe even something related with the thoracic spine.  MRI of the thoracic spine is reviewed today report is reviewed with the patient as well as imaging and spine model.  It does show a left-sided small disc protrusion but without any focal compression and it is clearly left-sided.  This was at T5-6.  Really nothing to explain the pain that he is having.  Dry needling at least by the reports from [place] the physical therapist in our office shows that at least  initially seemed of gotten relief but then he states now the symptoms are actually worse now probably as much as ever or more.  He is very frustrated and depressed to some degree that he just cannot find any source of the pain or relief.  He has had all manner of conservative care.  He denies any real radicular pain along the rib but he does get some rib type pain.  He also endorses this increased pain to light touching and palpation along all the muscles in the epigastric region and shoulder blade region.  My exam last time did show taut bands and trigger points.  The pain seems to be right under his shoulder blade on the right.  Review of Systems  Cardiovascular: Positive for chest pain.  Gastrointestinal: Positive for diarrhea.  Musculoskeletal: Positive for back pain.  All other systems reviewed and are negative.  Otherwise per HPI.  Assessment & Plan: Visit Diagnoses:  1. Epigastric pain   2. Chest wall pain   3. Pain in thoracic spine   4. Myofascial pain syndrome   5. Chronic pain syndrome     Plan: Findings:  Chronic 2 to 3-year history of thoracic shoulder blade pain but also with anterior epigastric type chest wall pain.  Pain could be in a pattern of the referral from gallbladder or liver sources.  He has  not had any red flag symptoms.  We have undergone now MRI of the thoracic spine which is fairly benign.  He has tried nonsteroidal anti-inflammatories as well as gabapentin and other medications with no relief.  He has had physical therapy with dry needling with exacerbation of symptoms now.  Differential diagnosis is myofascial pain syndrome and possible subscapularis bursa pain.  Also by history this seemed to have started after inguinal hernia repair which obviously all this could be incidental to that finding or serendipity.  I think the best approach at this point other than telling the patient that I am really not sure where his pain is originating from his to try subscapularis  bursa injection laparoscopic guidance.  He does want try this.  We talked about the risk and benefits of this.  I am also going to have him actually see Dr. Junius Roads again just to get another set of eyes on him from a family medicine sports medicine standpoint both musculoskeletal and could this be a GI source of pain.  Otherwise am somewhat [place] and it may need to be a referral to tertiary pain center or back to see if this is a GI process.    Meds & Orders: No orders of the defined types were placed in this encounter.  No orders of the defined types were placed in this encounter.   Follow-up: Return for Right subscapular bursa injection with fluoroscopic guidance.   Procedures: No procedures performed  No notes on file   Clinical History: MRI THORACIC SPINE WITHOUT CONTRAST  TECHNIQUE: Multiplanar, multisequence MR imaging of the thoracic spine was performed. No intravenous contrast was administered.  COMPARISON:  Chest radiographs 01/25/2019. Lumbar MRI 03/18/2018.  FINDINGS: Limited cervical spine imaging:  Grossly negative.  Thoracic spine segmentation: Appears to be normal, and this numbering system appears concordant with the lumbar MRI in 2019.  Alignment:  Preserved thoracic kyphosis. No spondylolisthesis.  Vertebrae: Visualized bone marrow signal is within normal limits. No marrow edema or evidence of acute osseous abnormality.  Cord: Spinal cord signal is within normal limits at all visualized levels. Capacious thoracic spinal canal. The conus medullaris is mostly visible at T12-L1, appears stable since 2019 and negative.  Paraspinal and other soft tissues: Negative visible thoracic and upper abdominal viscera. Normal visible paraspinal soft tissues.  Disc levels:  T1-T2: Negative.  T2-T3: Negative.  T3-T4: Negative.  T4-T5: Negative.  T5-T6: Subtle left paracentral disc bulge or protrusion (series 12, image 17 and series 19, image 8. This  effaces the ventral CSF space, but the thecal sac is capacious with no spinal stenosis. No foraminal involvement.  T6-T7: Negative.  T7-T8: Negative.  T8-T9: Negative.  T9-T10: Negative.  T10-T11: Mild disc desiccation and disc space loss. But no convincing disc bulge or herniation. No stenosis.  T11-T12: Negative disc. Mild right facet hypertrophy without stenosis.  T12-L1: Mostly visible, stable since 2019 and negative.  IMPRESSION: 1. Subtle left paracentral disc herniation at T5-T6 without spinal stenosis or convincing neural impingement. Minimal disc degeneration at T10-T11. 2. Otherwise normal thoracic spine. Capacious thoracic spinal canal with normal thoracic spinal cord. No osseous abnormality identified.   Electronically Signed   By: Genevie Ann M.D.   On: 11/24/2019 10:32  --- MRI LUMBAR SPINE WITHOUT CONTRAST    TECHNIQUE:  Multiplanar, multisequence MR imaging of the lumbar spine was  performed. No intravenous contrast was administered.    COMPARISON: None.    FINDINGS:  Segmentation: Normal    Alignment: Normal  Vertebrae: Normal bone marrow. Negative for fracture or mass.    Conus medullaris and cauda equina: Conus extends to the L1-2 level.  Conus and cauda equina appear normal.    Paraspinal and other soft tissues: No paraspinous soft tissue mass  or fluid collection    Disc levels:    L1-2: Negative    L2-3: Negative    L3-4: Negative    L4-5: Mild disc bulging and mild facet degeneration. Negative for  stenosis.    L5-S1: Mild disc degeneration. Small central disc protrusion with  small central annular fissure. Negative for neural impingement or  stenosis.    IMPRESSION:  Small central disc protrusion L5-S1 with small central annular  fissure. Negative for neural impingement or stenosis.      Electronically Signed  By: Marlan Palau M.D.  On: 03/19/2018 07:03  ----  CLINICAL DATA: Chronic chest  pain    EXAM:  CHEST - 2 VIEW    COMPARISON: 09/14/2018    FINDINGS:  The heart size and mediastinal contours are within normal limits.  Both lungs are clear. The visualized skeletal structures are  unremarkable.    IMPRESSION:  No active cardiopulmonary disease.      Electronically Signed  By: Elige Ko  On: 01/25/2019 08:41   He reports that he has been smoking cigarettes. He has a 13.00 pack-year smoking history. He has never used smokeless tobacco. No results for input(s): HGBA1C, LABURIC in the last 8760 hours.  Objective:  VS:  HT:6\' 3"  (190.5 cm)   WT:220 lb (99.8 kg)  BMI:27.5    BP:131/80  HR:80bpm  TEMP: ( )  RESP:  Physical Exam Vitals and nursing note reviewed.  Constitutional:      General: He is not in acute distress.    Appearance: Normal appearance. He is well-developed.  HENT:     Head: Normocephalic and atraumatic.  Eyes:     Conjunctiva/sclera: Conjunctivae normal.     Pupils: Pupils are equal, round, and reactive to light.  Cardiovascular:     Rate and Rhythm: Normal rate.     Pulses: Normal pulses.     Heart sounds: Normal heart sounds.  Pulmonary:     Effort: Pulmonary effort is normal. No respiratory distress.  Musculoskeletal:     Cervical back: Normal range of motion and neck supple. No rigidity.     Right lower leg: No edema.     Left lower leg: No edema.     Comments: Patient has normal alignment of the cervical and thoracic spine.  He has no scapular winging.  He does have trigger points in the rhomboids infraspinatus and levator scapula.  There are taut bands paraspinally.  He has tenderness to really any movement of the right scapula or any real palpation of the skin along that area or even anteriorly.  He has normal range of motion of the shoulders however passively even though there is pain.  He has no pain over the thoracic spine with deep palpation.  Skin:    General: Skin is warm and dry.     Findings: No erythema or  rash.  Neurological:     General: No focal deficit present.     Mental Status: He is alert and oriented to person, place, and time.     Sensory: No sensory deficit.     Coordination: Coordination normal.     Gait: Gait normal.  Psychiatric:        Mood and Affect: Mood normal.  Behavior: Behavior normal.     Ortho Exam Imaging: No results found.  Past Medical/Family/Surgical/Social History: Medications & Allergies reviewed per EMR, new medications updated. Patient Active Problem List   Diagnosis Date Noted  . Diarrhea 08/09/2019  . Chest wall pain 09/14/2018  . Cough 09/14/2018  . Nicotine dependence, cigarettes, uncomplicated 09/14/2018  . Viral upper respiratory tract infection 07/24/2018  . Epigastric pain 06/04/2018  . Chronic pain syndrome 05/11/2018   Past Medical History:  Diagnosis Date  . Myalgia    Family History  Problem Relation Age of Onset  . Lupus Mother   . Other Sister        RSD  . Other Father        hx unknown   . Colon cancer Neg Hx   . Esophageal cancer Neg Hx   . Rectal cancer Neg Hx    Past Surgical History:  Procedure Laterality Date  . HERNIA REPAIR Left 2018   Social History   Occupational History  . Occupation: [employer]    Comment: [employer]  Tobacco Use  . Smoking status: Current Every Day Smoker    Packs/day: 1.00    Years: 13.00    Pack years: 13.00    Types: Cigarettes  . Smokeless tobacco: Never Used  Substance and Sexual Activity  . Alcohol use: Not Currently    Comment: rarely, quit 03/2018  . Drug use: Yes    Types: Marijuana    Comment: daily  . Sexual activity: Yes

## 2019-12-26 ENCOUNTER — Ambulatory Visit: Payer: BC Managed Care – PPO | Attending: Internal Medicine

## 2019-12-26 DIAGNOSIS — Z23 Encounter for immunization: Secondary | ICD-10-CM | POA: Insufficient documentation

## 2019-12-26 NOTE — Progress Notes (Signed)
   Covid-19 Vaccination Clinic  Name:  Garrett Cunningham    MRN: [id number] DOB: 1986/10/04  12/26/2019  [name] was observed post Covid-19 immunization for 15 minutes without incident. He was provided with Vaccine Information Sheet and instruction to access the V-Safe system.   [name] was instructed to call 911 with any severe reactions post vaccine: Marland Kitchen Difficulty breathing  . Swelling of face and throat  . A fast heartbeat  . A bad rash all over body  . Dizziness and weakness   Immunizations Administered    Name Date Dose VIS Date Route   Pfizer COVID-19 Vaccine 12/26/2019  4:55 PM 0.3 mL 10/01/2019 Intramuscular   Manufacturer: [employer], [employer]   Lot: [id number]   NDC: [address]-8

## 2019-12-28 ENCOUNTER — Ambulatory Visit: Payer: Self-pay

## 2019-12-28 ENCOUNTER — Encounter: Payer: Self-pay | Admitting: Physical Medicine and Rehabilitation

## 2019-12-28 ENCOUNTER — Ambulatory Visit (INDEPENDENT_AMBULATORY_CARE_PROVIDER_SITE_OTHER): Payer: BC Managed Care – PPO | Admitting: Physical Medicine and Rehabilitation

## 2019-12-28 ENCOUNTER — Other Ambulatory Visit: Payer: Self-pay

## 2019-12-28 DIAGNOSIS — M778 Other enthesopathies, not elsewhere classified: Secondary | ICD-10-CM

## 2019-12-28 NOTE — Progress Notes (Signed)
Garrett Cunningham - 34 y.o. male MRN [id number]  Date of birth: 1985-12-22  Office Visit Note: Visit Date: 12/28/2019 PCP: Luetta Nutting, DO Referred by: Luetta Nutting, DO  Subjective: Chief Complaint  Patient presents with  . Right Shoulder - Pain   HPI: Garrett Cunningham is a 34 y.o. male who comes in today For planned right subscapularis bursa injection with fluoroscopic guidance.  The patient has failed conservative care including home exercise, medications, time and activity modification.  This injection will be diagnostic and hopefully therapeutic.  Please see requesting physician notes for further details and justification.   ROS Otherwise per HPI.  Assessment & Plan: Visit Diagnoses:  1. Subscapularis tendinitis of right shoulder     Plan: No additional findings.   Meds & Orders: No orders of the defined types were placed in this encounter.   Orders Placed This Encounter  Procedures  . Large Joint Inj  . XR C-ARM NO REPORT    Follow-up: Return if symptoms worsen or fail to improve.   Procedures: Large Joint Inj (Right Subscapularis) on 12/28/2019 8:43 AM Indications: pain and diagnostic evaluation Details: 22 G 3.5 in needle, fluoroscopy-guided anteromedial approach  Arthrogram: No  Medications: 3 mL bupivacaine 0.5 %; 40 mg triamcinolone acetonide 40 MG/ML Outcome: tolerated well, no immediate complications  There was excellent flow of contrast producing a partial bursa-gram. Procedure, treatment alternatives, risks and benefits explained, specific risks discussed. Consent was given by the patient. Immediately prior to procedure a time out was called to verify the correct patient, procedure, equipment, support staff and site/side marked as required. Patient was prepped and draped in the usual sterile fashion.      No notes on file   Clinical History: MRI THORACIC SPINE WITHOUT CONTRAST  TECHNIQUE: Multiplanar, multisequence MR imaging of the thoracic  spine was performed. No intravenous contrast was administered.  COMPARISON:  Chest radiographs 01/25/2019. Lumbar MRI 03/18/2018.  FINDINGS: Limited cervical spine imaging:  Grossly negative.  Thoracic spine segmentation: Appears to be normal, and this numbering system appears concordant with the lumbar MRI in 2019.  Alignment:  Preserved thoracic kyphosis. No spondylolisthesis.  Vertebrae: Visualized bone marrow signal is within normal limits. No marrow edema or evidence of acute osseous abnormality.  Cord: Spinal cord signal is within normal limits at all visualized levels. Capacious thoracic spinal canal. The conus medullaris is mostly visible at T12-L1, appears stable since 2019 and negative.  Paraspinal and other soft tissues: Negative visible thoracic and upper abdominal viscera. Normal visible paraspinal soft tissues.  Disc levels:  T1-T2: Negative.  T2-T3: Negative.  T3-T4: Negative.  T4-T5: Negative.  T5-T6: Subtle left paracentral disc bulge or protrusion (series 12, image 17 and series 19, image 8. This effaces the ventral CSF space, but the thecal sac is capacious with no spinal stenosis. No foraminal involvement.  T6-T7: Negative.  T7-T8: Negative.  T8-T9: Negative.  T9-T10: Negative.  T10-T11: Mild disc desiccation and disc space loss. But no convincing disc bulge or herniation. No stenosis.  T11-T12: Negative disc. Mild right facet hypertrophy without stenosis.  T12-L1: Mostly visible, stable since 2019 and negative.  IMPRESSION: 1. Subtle left paracentral disc herniation at T5-T6 without spinal stenosis or convincing neural impingement. Minimal disc degeneration at T10-T11. 2. Otherwise normal thoracic spine. Capacious thoracic spinal canal with normal thoracic spinal cord. No osseous abnormality identified.   Electronically Signed   By: Genevie Ann M.D.   On: 11/24/2019 10:32  --- MRI LUMBAR SPINE WITHOUT CONTRAST  TECHNIQUE:  Multiplanar, multisequence MR imaging of the lumbar spine was  performed. No intravenous contrast was administered.    COMPARISON: None.    FINDINGS:  Segmentation: Normal    Alignment: Normal    Vertebrae: Normal bone marrow. Negative for fracture or mass.    Conus medullaris and cauda equina: Conus extends to the L1-2 level.  Conus and cauda equina appear normal.    Paraspinal and other soft tissues: No paraspinous soft tissue mass  or fluid collection    Disc levels:    L1-2: Negative    L2-3: Negative    L3-4: Negative    L4-5: Mild disc bulging and mild facet degeneration. Negative for  stenosis.    L5-S1: Mild disc degeneration. Small central disc protrusion with  small central annular fissure. Negative for neural impingement or  stenosis.    IMPRESSION:  Small central disc protrusion L5-S1 with small central annular  fissure. Negative for neural impingement or stenosis.      Electronically Signed  By: Marlan Palau M.D.  On: 03/19/2018 07:03  ----  CLINICAL DATA: Chronic chest pain    EXAM:  CHEST - 2 VIEW    COMPARISON: 09/14/2018    FINDINGS:  The heart size and mediastinal contours are within normal limits.  Both lungs are clear. The visualized skeletal structures are  unremarkable.    IMPRESSION:  No active cardiopulmonary disease.      Electronically Signed  By: Elige Ko  On: 01/25/2019 08:41   He reports that he has been smoking cigarettes. He has a 13.00 pack-year smoking history. He has never used smokeless tobacco. No results for input(s): HGBA1C, LABURIC in the last 8760 hours.  Objective:  VS:  HT:    WT:   BMI:     BP:   HR: bpm  TEMP: ( )  RESP:  Physical Exam  Ortho Exam Imaging: XR C-ARM NO REPORT  Result Date: 12/28/2019 Please see Notes tab for imaging impression.   Past Medical/Family/Surgical/Social History: Medications & Allergies reviewed per EMR, new  medications updated. Patient Active Problem List   Diagnosis Date Noted  . Diarrhea 08/09/2019  . Chest wall pain 09/14/2018  . Cough 09/14/2018  . Nicotine dependence, cigarettes, uncomplicated 09/14/2018  . Viral upper respiratory tract infection 07/24/2018  . Epigastric pain 06/04/2018  . Chronic pain syndrome 05/11/2018   Past Medical History:  Diagnosis Date  . Myalgia    Family History  Problem Relation Age of Onset  . Lupus Mother   . Other Sister        RSD  . Other Father        hx unknown   . Colon cancer Neg Hx   . Esophageal cancer Neg Hx   . Rectal cancer Neg Hx    Past Surgical History:  Procedure Laterality Date  . HERNIA REPAIR Left 2018   Social History   Occupational History  . Occupation: [employer]    Comment: [employer]  Tobacco Use  . Smoking status: Current Every Day Smoker    Packs/day: 1.00    Years: 13.00    Pack years: 13.00    Types: Cigarettes  . Smokeless tobacco: Never Used  Substance and Sexual Activity  . Alcohol use: Not Currently    Comment: rarely, quit 03/2018  . Drug use: Yes    Types: Marijuana    Comment: daily  . Sexual activity: Yes

## 2019-12-28 NOTE — Progress Notes (Signed)
 .  Numeric Pain Rating Scale and Functional Assessment Average Pain 8   In the last MONTH (on 0-10 scale) has pain interfered with the following?  1. General activity like being  able to carry out your everyday physical activities such as walking, climbing stairs, carrying groceries, or moving a chair?  Rating(8)    -Dye Allergies.  

## 2019-12-29 MED ORDER — TRIAMCINOLONE ACETONIDE 40 MG/ML IJ SUSP
40.0000 mg | INTRAMUSCULAR | Status: AC | PRN
  Administered 2019-12-28: 40 mg via INTRA_ARTICULAR

## 2019-12-29 MED ORDER — BUPIVACAINE HCL 0.5 % IJ SOLN
3.0000 mL | INTRAMUSCULAR | Status: AC | PRN
  Administered 2019-12-28: 3 mL via INTRA_ARTICULAR

## 2020-01-11 ENCOUNTER — Encounter: Payer: Self-pay | Admitting: Family Medicine

## 2020-01-11 ENCOUNTER — Ambulatory Visit (INDEPENDENT_AMBULATORY_CARE_PROVIDER_SITE_OTHER): Payer: BC Managed Care – PPO | Admitting: Family Medicine

## 2020-01-11 ENCOUNTER — Other Ambulatory Visit: Payer: Self-pay

## 2020-01-11 DIAGNOSIS — R1011 Right upper quadrant pain: Secondary | ICD-10-CM

## 2020-01-11 DIAGNOSIS — R0789 Other chest pain: Secondary | ICD-10-CM | POA: Diagnosis not present

## 2020-01-11 NOTE — Progress Notes (Signed)
Office Visit Note   Patient: Garrett Cunningham           Date of Birth: 09/13/1986           MRN: [id number] Visit Date: 01/11/2020 Requested by: Luetta Nutting, [address],  [place] [address] PCP: Luetta Nutting, DO  Subjective: Chief Complaint  Patient presents with  . Middle Back - Pain, Follow-up    ESI from Dr. Ernestina Patches did not help. Does have good and bad days. The patient has not found anything that triggers the pain.    HPI: He is here for follow-up chronic right-sided chest wall pain.  Since I last saw him he went to physical therapy and had some dry needling which did not seem to make any difference.  He then went to Dr. Ernestina Patches for scapulothoracic bursa injection recently which did not help either.  His pain is unchanged, chronic pain starting in the right upper quadrant abdomen and radiating to the side of his chest and then to the scapular area on the right side.  It hurts constantly, but worse when sneezing or taking a deep breath.  Of note, his mother had her gallbladder removed about a year or 2 ago.  Also, his sister had gallbladder symptoms with negative ultrasound testing and eventually underwent cholecystectomy and was told that her gallbladder looked terrible at the time of surgery despite normal testing.  Patient has had intermittent diarrhea for the past couple years.  Foods do not seem to have a major effect on his pain.              ROS:   All other systems were reviewed and are negative.  Objective: Vital Signs: There were no vitals taken for this visit.  Physical Exam:  General:  Alert and oriented, in no acute distress. Pulm:  Breathing unlabored. Psy:  Normal mood, congruent affect.  Abdomen: He is tender in the right upper quadrant.  No hepatosplenomegaly. Back: He has multiple tender trigger points especially near the midportion of the thoracic spine to the right of midline.  Imaging: None today  Assessment & Plan: 1.   Chronic right-sided upper abdominal pain and thoracic pain, cannot rule out gallbladder disease. -Elected to refer him to Dr. Donne Hazel for further evaluation.  Apparently he had a normal abdominal ultrasound a couple years ago so I will not repeat any testing right now, I will leave that up to Dr. Donne Hazel. -I asked the patient to contact his sister in [place] to get more details about her final diagnosis.     Procedures: No procedures performed  No notes on file     PMFS History: Patient Active Problem List   Diagnosis Date Noted  . Diarrhea 08/09/2019  . Chest wall pain 09/14/2018  . Cough 09/14/2018  . Nicotine dependence, cigarettes, uncomplicated 04/15/9484  . Viral upper respiratory tract infection 07/24/2018  . Epigastric pain 06/04/2018  . Chronic pain syndrome 05/11/2018   Past Medical History:  Diagnosis Date  . Myalgia     Family History  Problem Relation Age of Onset  . Lupus Mother   . Other Sister        RSD  . Other Father        hx unknown   . Colon cancer Neg Hx   . Esophageal cancer Neg Hx   . Rectal cancer Neg Hx     Past Surgical History:  Procedure Laterality Date  . HERNIA  REPAIR Left 2018   Social History   Occupational History  . Occupation: [employer]    Comment: [employer]  Tobacco Use  . Smoking status: Current Every Day Smoker    Packs/day: 1.00    Years: 13.00    Pack years: 13.00    Types: Cigarettes  . Smokeless tobacco: Never Used  Substance and Sexual Activity  . Alcohol use: Not Currently    Comment: rarely, quit 03/2018  . Drug use: Yes    Types: Marijuana    Comment: daily  . Sexual activity: Yes

## 2020-01-26 ENCOUNTER — Ambulatory Visit: Payer: BC Managed Care – PPO | Attending: Internal Medicine

## 2020-01-26 DIAGNOSIS — Z23 Encounter for immunization: Secondary | ICD-10-CM

## 2020-01-26 NOTE — Progress Notes (Signed)
   Covid-19 Vaccination Clinic  Name:  Garrett Cunningham    MRN: [id number] DOB: [birth year]  01/26/2020  [name] was observed post Covid-19 immunization for 15 minutes without incident. He was provided with Vaccine Information Sheet and instruction to access the V-Safe system.   [name] was instructed to call 911 with any severe reactions post vaccine: Marland Kitchen Difficulty breathing  . Swelling of face and throat  . A fast heartbeat  . A bad rash all over body  . Dizziness and weakness   Immunizations Administered    Name Date Dose VIS Date Route   Pfizer COVID-19 Vaccine 01/26/2020 10:42 AM 0.3 mL 10/01/2019 Intramuscular   Manufacturer: [employer], [employer]   Lot: [id number]   NDC: [address]-2

## 2020-02-11 ENCOUNTER — Ambulatory Visit: Payer: Self-pay | Admitting: Surgery

## 2020-02-11 DIAGNOSIS — K828 Other specified diseases of gallbladder: Secondary | ICD-10-CM | POA: Diagnosis not present

## 2020-02-11 DIAGNOSIS — R1013 Epigastric pain: Secondary | ICD-10-CM | POA: Diagnosis not present

## 2020-02-11 NOTE — H&P (View-Only) (Signed)
Lorraine Lax Appointment: 02/11/2020 10:10 AM Location: [facility] Patient #: [id number] DOB: [birth year] Married / Language: Cleophus Molt / Race: White Male  History of Present Illness Marcello Moores A. Janeli Lewison MD; 02/11/2020 1:03 PM) Patient words: Patient presents for evaluation of epigastric abdominal pain. He was seen 2 years ago for an inguinal hernia repair. Since that time he developed epigastric, right upper quadrant and chest pain which radiates. It is not necessarily associated with eating but is become more prominent and is associated with nausea and some heartburn symptoms. Nothing seems to make it better or worse. He was evaluated 2 years ago with the onset the symptoms with ultrasound. He has never had a HIDA study. His symptoms are becoming more frequent and the discomfort in his epigastrium is more severe lasting minutes and then resolving.  The patient is a 34 year old male.   Allergies (Tanisha A. Owens Shark, [place]; 02/11/2020 10:08 AM) No Known Drug Allergies [03/24/2017]: Allergies Reconciled  Medication History (Tanisha A. Owens Shark, [place]; 02/11/2020 10:08 AM) No Current Medications Medications Reconciled    Vitals (Tanisha A. Brown RMA; 02/11/2020 10:09 AM) 02/11/2020 10:08 AM Weight: 224.2 lb Height: 75in Body Surface Area: 2.3 m Body Mass Index: 28.02 kg/m  Temp.: 8F  Pulse: 78 (Regular)  BP: 132/86(Sitting, Left Arm, Standard)        Physical Exam (Mckyle Solanki A. Dawanna Grauberger MD; 02/11/2020 1:04 PM)  General Mental Status-Alert. General Appearance-Consistent with stated age. Hydration-Well hydrated. Voice-Normal.  Eye Eyeball - Bilateral-Extraocular movements intact. Sclera/Conjunctiva - Bilateral-No scleral icterus.  Chest and Lung Exam Chest and lung exam reveals -quiet, even and easy respiratory effort with no use of accessory muscles and on auscultation, normal breath sounds, no adventitious sounds and normal vocal  resonance. Inspection Chest Wall - Normal. Back - normal.  Cardiovascular Cardiovascular examination reveals -on palpation PMI is normal in location and amplitude, no palpable S3 or S4. Normal cardiac borders., normal heart sounds, regular rate and rhythm with no murmurs, carotid auscultation reveals no bruits and normal pedal pulses bilaterally.  Abdomen Note: Mild tenderness epigastrium and right upper quadrant. Negative Murphy sign.  Musculoskeletal Normal Exam - Left-Upper Extremity Strength Normal and Lower Extremity Strength Normal. Normal Exam - Right-Upper Extremity Strength Normal, Lower Extremity Weakness.    Assessment & Plan (Tung Pustejovsky A. Dempsey Knotek MD; 02/11/2020 1:05 PM)  EPIGASTRIC PAIN (R10.13) Impression: Discussed treatment options and testing options. He has had extensive medical management and workup of this pain. He has not had a HIDA study I discussed the pros and cons of getting it but I think he would benefit from cholecystectomy given his symptomatology. He agrees what proceeded with cholecystectomy understanding success rates in this circumstance without stones is about 50% at relieving symptoms. We discussed complications as well as lifestyle modification and diet modification as Well after cholecystectomy. He understands limitations of this but agrees to proceed since all other avenues have been explored The procedure has been discussed with the patient. Risks of laparoscopic cholecystectomy include bleeding, infection, bile duct injury, leak, death, open surgery, diarrhea, other surgery, organ injury, blood vessel injury, DVT, and additional care.  Current Plans You are being scheduled for surgery- Our schedulers will call you.  You should hear from our office's scheduling department within 5 working days about the location, date, and time of surgery. We try to make accommodations for patient's preferences in scheduling surgery, but sometimes the OR schedule or  the surgeon's schedule prevents [place] from making those accommodations.  If you have not  heard from our office [phone] in 5 working days, call the office and ask for your surgeon's nurse.  If you have other questions about your diagnosis, plan, or surgery, call the office and ask for your surgeon's nurse.   BILIARY DYSKINESIA (K82.8) Impression: Total time 30 minutes for examination, review of chart, review of studies, face-to-face time and documentation  Current Plans Pt Education - [facility] Laparosopic Post Op HCI ([place]) Pt Education - Laparoscopic Cholecystectomy: gallbladder The anatomy & physiology of hepatobiliary & pancreatic function was discussed. The pathophysiology of gallbladder dysfunction was discussed. Natural history risks without surgery was discussed. I feel the risks of no intervention will lead to serious problems that outweigh the operative risks; therefore, I recommended cholecystectomy to remove the pathology. I explained laparoscopic techniques with possible need for an open approach. Probable cholangiogram to evaluate the bilary tract was explained as well.  Risks such as bleeding, infection, abscess, leak, injury to other organs, need for further treatment, heart attack, death, and other risks were discussed. I noted a good likelihood this will help address the problem. Possibility that this will not correct all abdominal symptoms was explained. Goals of post-operative recovery were discussed as well. We will work to minimize complications. An educational handout further explaining the pathology and treatment options was given as well. Questions were answered. The patient expresses understanding & wishes to proceed with surgery.

## 2020-02-11 NOTE — H&P (Signed)
onathan A Soller Appointment: 02/11/2020 10:10 AM Location: [facility] Patient #: [id number] DOB: 10/30/1985 Married / Language: English / Race: White Male  History of Present Illness (Criston Chancellor A. Daylan Juhnke MD; 02/11/2020 1:03 PM) Patient words: Patient presents for evaluation of epigastric abdominal pain. He was seen 2 years ago for an inguinal hernia repair. Since that time he developed epigastric, right upper quadrant and chest pain which radiates. It is not necessarily associated with eating but is become more prominent and is associated with nausea and some heartburn symptoms. Nothing seems to make it better or worse. He was evaluated 2 years ago with the onset the symptoms with ultrasound. He has never had a HIDA study. His symptoms are becoming more frequent and the discomfort in his epigastrium is more severe lasting minutes and then resolving.  The patient is a 34 year old male.   Allergies (Tanisha A. Brown, RMA; 02/11/2020 10:08 AM) No Known Drug Allergies [03/24/2017]: Allergies Reconciled  Medication History (Tanisha A. Brown, RMA; 02/11/2020 10:08 AM) No Current Medications Medications Reconciled    Vitals (Tanisha A. Brown RMA; 02/11/2020 10:09 AM) 02/11/2020 10:08 AM Weight: 224.2 lb Height: 75in Body Surface Area: 2.3 m Body Mass Index: 28.02 kg/m  Temp.: 98F  Pulse: 78 (Regular)  BP: 132/86(Sitting, Left Arm, Standard)        Physical Exam (Cathalina Barcia A. Javoni Lucken MD; 02/11/2020 1:04 PM)  General Mental Status-Alert. General Appearance-Consistent with stated age. Hydration-Well hydrated. Voice-Normal.  Eye Eyeball - Bilateral-Extraocular movements intact. Sclera/Conjunctiva - Bilateral-No scleral icterus.  Chest and Lung Exam Chest and lung exam reveals -quiet, even and easy respiratory effort with no use of accessory muscles and on auscultation, normal breath sounds, no adventitious sounds and normal vocal  resonance. Inspection Chest Wall - Normal. Back - normal.  Cardiovascular Cardiovascular examination reveals -on palpation PMI is normal in location and amplitude, no palpable S3 or S4. Normal cardiac borders., normal heart sounds, regular rate and rhythm with no murmurs, carotid auscultation reveals no bruits and normal pedal pulses bilaterally.  Abdomen Note: Mild tenderness epigastrium and right upper quadrant. Negative Murphy sign.  Musculoskeletal Normal Exam - Left-Upper Extremity Strength Normal and Lower Extremity Strength Normal. Normal Exam - Right-Upper Extremity Strength Normal, Lower Extremity Weakness.    Assessment & Plan (Onnika Siebel A. Mary Secord MD; 02/11/2020 1:05 PM)  EPIGASTRIC PAIN (R10.13) Impression: Discussed treatment options and testing options. He has had extensive medical management and workup of this pain. He has not had a HIDA study I discussed the pros and cons of getting it but I think he would benefit from cholecystectomy given his symptomatology. He agrees what proceeded with cholecystectomy understanding success rates in this circumstance without stones is about 50% at relieving symptoms. We discussed complications as well as lifestyle modification and diet modification as Well after cholecystectomy. He understands limitations of this but agrees to proceed since all other avenues have been explored The procedure has been discussed with the patient. Risks of laparoscopic cholecystectomy include bleeding, infection, bile duct injury, leak, death, open surgery, diarrhea, other surgery, organ injury, blood vessel injury, DVT, and additional care.  Current Plans You are being scheduled for surgery- Our schedulers will call you.  You should hear from our office's scheduling department within 5 working days about the location, date, and time of surgery. We try to make accommodations for patient's preferences in scheduling surgery, but sometimes the OR schedule or  the surgeon's schedule prevents us from making those accommodations.  If you have not   heard from our office [phone] in 5 working days, call the office and ask for your surgeon's nurse.  If you have other questions about your diagnosis, plan, or surgery, call the office and ask for your surgeon's nurse.   BILIARY DYSKINESIA (K82.8) Impression: Total time 30 minutes for examination, review of chart, review of studies, face-to-face time and documentation  Current Plans Pt Education - [facility] Laparosopic Post Op HCI ([place]) Pt Education - Laparoscopic Cholecystectomy: gallbladder The anatomy & physiology of hepatobiliary & pancreatic function was discussed. The pathophysiology of gallbladder dysfunction was discussed. Natural history risks without surgery was discussed. I feel the risks of no intervention will lead to serious problems that outweigh the operative risks; therefore, I recommended cholecystectomy to remove the pathology. I explained laparoscopic techniques with possible need for an open approach. Probable cholangiogram to evaluate the bilary tract was explained as well.  Risks such as bleeding, infection, abscess, leak, injury to other organs, need for further treatment, heart attack, death, and other risks were discussed. I noted a good likelihood this will help address the problem. Possibility that this will not correct all abdominal symptoms was explained. Goals of post-operative recovery were discussed as well. We will work to minimize complications. An educational handout further explaining the pathology and treatment options was given as well. Questions were answered. The patient expresses understanding & wishes to proceed with surgery.

## 2020-02-17 NOTE — Progress Notes (Signed)
PCP - Everrett Coombe Cardiologist -   Chest x-ray -  EKG -  Stress Test -  ECHO -  Cardiac Cath -   Sleep Study -  CPAP -   Fasting Blood Sugar -  Checks Blood Sugar _____ times a day  Blood Thinner Instructions: Aspirin Instructions: Last Dose:  Anesthesia review:   Patient denies shortness of breath, fever, cough and chest pain at PAT appointment   NONE   Patient verbalized understanding of instructions that were given to them at the PAT appointment. Patient was also instructed that they will need to review over the PAT instructions again at home before surgery.

## 2020-02-17 NOTE — Patient Instructions (Addendum)
DUE TO COVID-19 ONLY ONE VISITOR IS ALLOWED TO COME WITH YOU AND STAY IN THE WAITING ROOM ONLY DURING PRE OP AND PROCEDURE DAY OF SURGERY. TWO  VISITOR MAY VISIT WITH YOU AFTER SURGERY IN YOUR PRIVATE ROOM DURING VISITING HOURS ONLY!  10a-8p  YOU NEED TO HAVE A COVID 19 TEST ON_4-30______ @___1 :55 pm____, THIS TEST MUST BE DONE BEFORE SURGERY, COME  [address], [place] , .  [facility]) ONCE YOUR COVID TEST IS COMPLETED, PLEASE BEGIN THE QUARANTINE INSTRUCTIONS AS OUTLINED IN YOUR HANDOUT.                Garrett Cunningham  02/17/2020   Your procedure is scheduled on: 02-22-20   Report to [facility] Main  Entrance   Report to admitting at      0800  AM     Call this number if you have problems the morning of surgery [phone]    Remember: Do not eat food or drink liquids :After Midnight.  BRUSH YOUR TEETH MORNING OF SURGERY AND RINSE YOUR MOUTH OUT, NO CHEWING GUM CANDY OR MINTS.     Take these medicines the morning of surgery with A SIP OF WATER: none                                 You may not have any metal on your body including hair pins and              piercings  Do not wear jewelry,  lotions, powders or perfumes, deodorant                          Men may shave face and neck.   Do not bring valuables to the hospital. [facility] IS NOT             RESPONSIBLE   FOR VALUABLES.  Contacts, dentures or bridgework may not be worn into surgery.      Patients discharged the day of surgery will not be allowed to drive home. IF YOU ARE HAVING SURGERY AND GOING HOME THE SAME DAY, YOU MUST HAVE AN ADULT TO DRIVE YOU HOME AND BE WITH YOU FOR 24 HOURS. YOU MAY GO HOME BY TAXI OR UBER OR ORTHERWISE, BUT AN ADULT MUST ACCOMPANY YOU HOME AND STAY WITH YOU FOR 24 HOURS.  Name and phone number of your driver:  Special Instructions: N/A              Please read over the following fact sheets you were  given: _____________________________________________________________________             [facility] - Preparing for Surgery Before surgery, you can play an important role.  Because skin is not sterile, your skin needs to be as free of germs as possible.  You can reduce the number of germs on your skin by washing with CHG (chlorahexidine gluconate) soap before surgery.  CHG is an antiseptic cleaner which kills germs and bonds with the skin to continue killing germs even after washing. Please DO NOT use if you have an allergy to CHG or antibacterial soaps.  If your skin becomes reddened/irritated stop using the CHG and inform your nurse when you arrive at Short Stay. Do not shave (including legs and underarms) for at least 48 hours prior to the first CHG shower.  You may shave your  face/neck. Please follow these instructions carefully:  1.  Shower with CHG Soap the night before surgery and the  morning of Surgery.  2.  If you choose to wash your hair, wash your hair first as usual with your  normal  shampoo.  3.  After you shampoo, rinse your hair and body thoroughly to remove the  shampoo.                           4.  Use CHG as you would any other liquid soap.  You can apply chg directly  to the skin and wash                       Gently with a scrungie or clean washcloth.  5.  Apply the CHG Soap to your body ONLY FROM THE NECK DOWN.   Do not use on face/ open                           Wound or open sores. Avoid contact with eyes, ears mouth and genitals (private parts).                       Wash face,  Genitals (private parts) with your normal soap.             6.  Wash thoroughly, paying special attention to the area where your surgery  will be performed.  7.  Thoroughly rinse your body with warm water from the neck down.  8.  DO NOT shower/wash with your normal soap after using and rinsing off  the CHG Soap.                9.  Pat yourself dry with a clean towel.            10.  Wear  clean pajamas.            11.  Place clean sheets on your bed the night of your first shower and do not  sleep with pets. Day of Surgery : Do not apply any lotions/deodorants the morning of surgery.  Please wear clean clothes to the hospital/surgery center.  FAILURE TO FOLLOW THESE INSTRUCTIONS MAY RESULT IN THE CANCELLATION OF YOUR SURGERY PATIENT SIGNATURE_________________________________  NURSE SIGNATURE__________________________________  ________________________________________________________________________

## 2020-02-18 ENCOUNTER — Other Ambulatory Visit (HOSPITAL_COMMUNITY)
Admission: RE | Admit: 2020-02-18 | Discharge: 2020-02-18 | Disposition: A | Discharge status: Home / Self Care | Payer: BC Managed Care – PPO | Source: Ambulatory Visit | Attending: Surgery | Admitting: Surgery

## 2020-02-18 DIAGNOSIS — Z20822 Contact with and (suspected) exposure to covid-19: Secondary | ICD-10-CM | POA: Diagnosis not present

## 2020-02-18 DIAGNOSIS — Z01812 Encounter for preprocedural laboratory examination: Secondary | ICD-10-CM | POA: Diagnosis not present

## 2020-02-19 LAB — SARS CORONAVIRUS 2 (TAT 6-24 HRS): SARS Coronavirus 2: NEGATIVE

## 2020-02-21 ENCOUNTER — Encounter (HOSPITAL_COMMUNITY)
Admission: RE | Admit: 2020-02-21 | Discharge: 2020-02-21 | Disposition: A | Discharge status: Home / Self Care | Payer: BC Managed Care – PPO | Source: Ambulatory Visit | Attending: Surgery | Admitting: Surgery

## 2020-02-21 ENCOUNTER — Other Ambulatory Visit: Payer: Self-pay

## 2020-02-21 ENCOUNTER — Encounter (HOSPITAL_COMMUNITY): Payer: Self-pay

## 2020-02-21 DIAGNOSIS — Z01812 Encounter for preprocedural laboratory examination: Secondary | ICD-10-CM | POA: Insufficient documentation

## 2020-02-21 LAB — COMPREHENSIVE METABOLIC PANEL
ALT: 17 U/L (ref 0–44)
AST: 21 U/L (ref 15–41)
Albumin: 4.5 g/dL (ref 3.5–5.0)
Alkaline Phosphatase: 51 U/L (ref 38–126)
Anion gap: 7 (ref 5–15)
BUN: 13 mg/dL (ref 6–20)
CO2: 27 mmol/L (ref 22–32)
Calcium: 9.1 mg/dL (ref 8.9–10.3)
Chloride: 106 mmol/L (ref 98–111)
Creatinine, Ser: 0.98 mg/dL (ref 0.61–1.24)
GFR calc Af Amer: >60 mL/min (ref >60)
GFR calc non Af Amer: >60 mL/min (ref >60)
Glucose, Bld: 102 mg/dL — ABNORMAL HIGH (ref 70–99)
Potassium: 4.5 mmol/L (ref 3.5–5.1)
Sodium: 140 mmol/L (ref 135–145)
Total Bilirubin: 0.8 mg/dL (ref 0.3–1.2)
Total Protein: 7.1 g/dL (ref 6.5–8.1)

## 2020-02-21 LAB — CBC WITH DIFFERENTIAL/PLATELET
Abs Immature Granulocytes: 0.02 10*3/uL (ref 0.00–0.07)
Basophils Absolute: 0.1 10*3/uL (ref 0.0–0.1)
Basophils Relative: 1 %
Eosinophils Absolute: 0 10*3/uL (ref 0.0–0.5)
Eosinophils Relative: 0 %
HCT: 46.2 % (ref 39.0–52.0)
Hemoglobin: 14.6 g/dL (ref 13.0–17.0)
Immature Granulocytes: 0 %
Lymphocytes Relative: 25 %
Lymphs Abs: 1.8 10*3/uL (ref 0.7–4.0)
MCH: 28.3 pg (ref 26.0–34.0)
MCHC: 31.6 g/dL (ref 30.0–36.0)
MCV: 89.7 fL (ref 80.0–100.0)
Monocytes Absolute: 0.6 10*3/uL (ref 0.1–1.0)
Monocytes Relative: 8 %
Neutro Abs: 4.7 10*3/uL (ref 1.7–7.7)
Neutrophils Relative %: 66 %
Platelets: 249 10*3/uL (ref 150–400)
RBC: 5.15 MIL/uL (ref 4.22–5.81)
RDW: 12.4 % (ref 11.5–15.5)
WBC: 7.2 10*3/uL (ref 4.0–10.5)
nRBC: 0 % (ref 0.0–0.2)

## 2020-02-22 ENCOUNTER — Ambulatory Visit (HOSPITAL_COMMUNITY)
Admission: RE | Admit: 2020-02-22 | Discharge: 2020-02-22 | Disposition: A | Discharge status: Home / Self Care | Payer: BC Managed Care – PPO | Attending: Surgery | Admitting: Surgery

## 2020-02-22 ENCOUNTER — Encounter (HOSPITAL_COMMUNITY)
Admission: RE | Disposition: A | Discharge status: Home / Self Care | Payer: Self-pay | Source: Home / Self Care | Attending: Surgery

## 2020-02-22 ENCOUNTER — Ambulatory Visit (HOSPITAL_COMMUNITY): Payer: BC Managed Care – PPO | Admitting: Anesthesiology

## 2020-02-22 ENCOUNTER — Encounter (HOSPITAL_COMMUNITY): Payer: Self-pay | Admitting: Surgery

## 2020-02-22 DIAGNOSIS — R1011 Right upper quadrant pain: Secondary | ICD-10-CM | POA: Diagnosis not present

## 2020-02-22 DIAGNOSIS — F17200 Nicotine dependence, unspecified, uncomplicated: Secondary | ICD-10-CM | POA: Insufficient documentation

## 2020-02-22 DIAGNOSIS — R1013 Epigastric pain: Secondary | ICD-10-CM | POA: Diagnosis not present

## 2020-02-22 DIAGNOSIS — K811 Chronic cholecystitis: Secondary | ICD-10-CM | POA: Insufficient documentation

## 2020-02-22 DIAGNOSIS — K8010 Calculus of gallbladder with chronic cholecystitis without obstruction: Secondary | ICD-10-CM | POA: Diagnosis not present

## 2020-02-22 HISTORY — PX: CHOLECYSTECTOMY: SHX55

## 2020-02-22 SURGERY — LAPAROSCOPIC CHOLECYSTECTOMY
Anesthesia: General

## 2020-02-22 MED ORDER — PROPOFOL 10 MG/ML IV BOLUS
INTRAVENOUS | Status: DC | PRN
  Administered 2020-02-22: 200 mg via INTRAVENOUS

## 2020-02-22 MED ORDER — DEXAMETHASONE SODIUM PHOSPHATE 10 MG/ML IJ SOLN
INTRAMUSCULAR | Status: DC | PRN
  Administered 2020-02-22: 8 mg via INTRAVENOUS

## 2020-02-22 MED ORDER — BUPIVACAINE HCL (PF) 0.25 % IJ SOLN
INTRAMUSCULAR | Status: AC
  Filled 2020-02-22: qty 30

## 2020-02-22 MED ORDER — FENTANYL CITRATE (PF) 100 MCG/2ML IJ SOLN
INTRAMUSCULAR | Status: AC
  Filled 2020-02-22: qty 2

## 2020-02-22 MED ORDER — ROCURONIUM BROMIDE 10 MG/ML (PF) SYRINGE
PREFILLED_SYRINGE | INTRAVENOUS | Status: DC | PRN
  Administered 2020-02-22: 10 mg via INTRAVENOUS
  Administered 2020-02-22: 40 mg via INTRAVENOUS

## 2020-02-22 MED ORDER — ONDANSETRON HCL 4 MG/2ML IJ SOLN
INTRAMUSCULAR | Status: AC
  Filled 2020-02-22: qty 2

## 2020-02-22 MED ORDER — OXYCODONE HCL 5 MG PO TABS: 5.0000 mg | ORAL_TABLET | Freq: Once | ORAL | Status: DC | PRN

## 2020-02-22 MED ORDER — LACTATED RINGERS IV SOLN
INTRAVENOUS | Status: DC | PRN
  Administered 2020-02-22: 1000 mL via INTRAVENOUS

## 2020-02-22 MED ORDER — FENTANYL CITRATE (PF) 250 MCG/5ML IJ SOLN
INTRAMUSCULAR | Status: AC
  Filled 2020-02-22: qty 5

## 2020-02-22 MED ORDER — CEFAZOLIN SODIUM-DEXTROSE 2-4 GM/100ML-% IV SOLN
2.0000 g | INTRAVENOUS | Status: AC
  Administered 2020-02-22: 2 g via INTRAVENOUS
  Filled 2020-02-22: qty 100

## 2020-02-22 MED ORDER — CELECOXIB 200 MG PO CAPS
200.0000 mg | ORAL_CAPSULE | ORAL | Status: AC
  Administered 2020-02-22: 200 mg via ORAL
  Filled 2020-02-22: qty 1

## 2020-02-22 MED ORDER — PROPOFOL 10 MG/ML IV BOLUS
INTRAVENOUS | Status: AC
  Filled 2020-02-22: qty 20

## 2020-02-22 MED ORDER — SUCCINYLCHOLINE CHLORIDE 20 MG/ML IJ SOLN
INTRAMUSCULAR | Status: DC | PRN
  Administered 2020-02-22: 120 mg via INTRAVENOUS

## 2020-02-22 MED ORDER — LIDOCAINE 2% (20 MG/ML) 5 ML SYRINGE
INTRAMUSCULAR | Status: DC | PRN
  Administered 2020-02-22: 80 mg via INTRAVENOUS

## 2020-02-22 MED ORDER — CHLORHEXIDINE GLUCONATE CLOTH 2 % EX PADS: 6.0000 | MEDICATED_PAD | Freq: Once | CUTANEOUS | Status: DC

## 2020-02-22 MED ORDER — PROMETHAZINE HCL 25 MG/ML IJ SOLN: 6.2500 mg | INTRAMUSCULAR | Status: DC | PRN

## 2020-02-22 MED ORDER — IBUPROFEN 800 MG PO TABS
800.0000 mg | ORAL_TABLET | Freq: Three times a day (TID) | ORAL | 0 refills | Status: DC | PRN
Start: 2020-02-22 — End: 2020-09-07

## 2020-02-22 MED ORDER — FENTANYL CITRATE (PF) 100 MCG/2ML IJ SOLN
25.0000 ug | INTRAMUSCULAR | Status: DC | PRN
  Administered 2020-02-22 (×2): 50 ug via INTRAVENOUS

## 2020-02-22 MED ORDER — OXYCODONE HCL 5 MG/5ML PO SOLN: 5.0000 mg | Freq: Once | ORAL | Status: DC | PRN

## 2020-02-22 MED ORDER — SUGAMMADEX SODIUM 200 MG/2ML IV SOLN
INTRAVENOUS | Status: DC | PRN
  Administered 2020-02-22: 200 mg via INTRAVENOUS

## 2020-02-22 MED ORDER — OXYCODONE HCL 5 MG PO TABS
5.0000 mg | ORAL_TABLET | Freq: Four times a day (QID) | ORAL | 0 refills | Status: DC | PRN
Start: 2020-02-22 — End: 2020-09-07

## 2020-02-22 MED ORDER — MIDAZOLAM HCL 2 MG/2ML IJ SOLN
INTRAMUSCULAR | Status: AC
  Filled 2020-02-22: qty 2

## 2020-02-22 MED ORDER — 0.9 % SODIUM CHLORIDE (POUR BTL) OPTIME
TOPICAL | Status: DC | PRN
  Administered 2020-02-22: 11:00:00 1000 mL

## 2020-02-22 MED ORDER — BUPIVACAINE HCL (PF) 0.25 % IJ SOLN
INTRAMUSCULAR | Status: DC | PRN
  Administered 2020-02-22: 10 mL

## 2020-02-22 MED ORDER — LACTATED RINGERS IV SOLN: INTRAVENOUS | Status: DC

## 2020-02-22 MED ORDER — FENTANYL CITRATE (PF) 100 MCG/2ML IJ SOLN
INTRAMUSCULAR | Status: DC | PRN
  Administered 2020-02-22: 50 ug via INTRAVENOUS
  Administered 2020-02-22: 100 ug via INTRAVENOUS
  Administered 2020-02-22: 50 ug via INTRAVENOUS
  Administered 2020-02-22: 100 ug via INTRAVENOUS
  Administered 2020-02-22: 50 ug via INTRAVENOUS

## 2020-02-22 MED ORDER — ONDANSETRON HCL 4 MG/2ML IJ SOLN
INTRAMUSCULAR | Status: DC | PRN
  Administered 2020-02-22: 4 mg via INTRAVENOUS

## 2020-02-22 MED ORDER — ACETAMINOPHEN 500 MG PO TABS
1000.0000 mg | ORAL_TABLET | ORAL | Status: AC
  Administered 2020-02-22: 1000 mg via ORAL
  Filled 2020-02-22: qty 2

## 2020-02-22 MED ORDER — BUPIVACAINE-EPINEPHRINE (PF) 0.5% -1:200000 IJ SOLN
INTRAMUSCULAR | Status: AC
  Filled 2020-02-22: qty 30

## 2020-02-22 MED ORDER — MIDAZOLAM HCL 5 MG/5ML IJ SOLN
INTRAMUSCULAR | Status: DC | PRN
  Administered 2020-02-22: 2 mg via INTRAVENOUS

## 2020-02-22 SURGICAL SUPPLY — 36 items
APPLIER CLIP ROT 10 11.4 M/L (STAPLE) ×2
CABLE HIGH FREQUENCY MONO STRZ (ELECTRODE) IMPLANT
CLIP APPLIE ROT 10 11.4 M/L (STAPLE) ×1 IMPLANT
COVER MAYO STAND STRL (DRAPES) ×2 IMPLANT
COVER WAND RF STERILE (DRAPES) IMPLANT
DECANTER SPIKE VIAL GLASS SM (MISCELLANEOUS) ×2 IMPLANT
DERMABOND ADVANCED (GAUZE/BANDAGES/DRESSINGS) ×1
DERMABOND ADVANCED .7 DNX12 (GAUZE/BANDAGES/DRESSINGS) ×1 IMPLANT
DRAPE C-ARM 42X120 X-RAY (DRAPES) IMPLANT
DRAPE UTILITY XL STRL (DRAPES) ×2 IMPLANT
DRAPE WARM FLUID 44X44 (DRAPES) ×2 IMPLANT
ELECT REM PT RETURN 15FT ADLT (MISCELLANEOUS) ×2 IMPLANT
GLOVE INDICATOR 8.0 STRL GRN (GLOVE) ×2 IMPLANT
GLOVE SS BIOGEL STRL SZ 7.5 (GLOVE) ×1 IMPLANT
GLOVE SUPERSENSE BIOGEL SZ 7.5 (GLOVE) ×1
GOWN STRL REUS W/TWL XL LVL3 (GOWN DISPOSABLE) ×4 IMPLANT
HEMOSTAT SURGICEL 4X8 (HEMOSTASIS) IMPLANT
KIT BASIN (CUSTOM PROCEDURE TRAY) ×2 IMPLANT
KIT TURNOVER KIT A (KITS) IMPLANT
PAD POSITIONING PINK XL (MISCELLANEOUS) IMPLANT
PENCIL SMOKE EVACUATOR (MISCELLANEOUS) IMPLANT
POUCH SPECIMEN RETRIEVAL 10MM (ENDOMECHANICALS) ×2 IMPLANT
PROTECTOR NERVE ULNAR (MISCELLANEOUS) IMPLANT
SCISSORS LAP 5X35 DISP (ENDOMECHANICALS) ×2 IMPLANT
SET CHOLANGIOGRAPH MIX (MISCELLANEOUS) ×2 IMPLANT
SET IRRIG TUBING LAPAROSCOPIC (IRRIGATION / IRRIGATOR) ×2 IMPLANT
SET TUBE SMOKE EVAC HIGH FLOW (TUBING) IMPLANT
SLEEVE XCEL OPT CAN 5 100 (ENDOMECHANICALS) ×2 IMPLANT
SUT MNCRL AB 4-0 PS2 18 (SUTURE) ×2 IMPLANT
TAPE CLOTH 4X10 WHT NS (GAUZE/BANDAGES/DRESSINGS) IMPLANT
TOWEL OR 17X26 10 PK STRL BLUE (TOWEL DISPOSABLE) ×2 IMPLANT
TOWEL OR NON WOVEN STRL DISP B (DISPOSABLE) ×2 IMPLANT
TRAY LAPAROSCOPIC (CUSTOM PROCEDURE TRAY) ×2 IMPLANT
TROCAR BLADELESS OPT 5 100 (ENDOMECHANICALS) ×2 IMPLANT
TROCAR XCEL BLUNT TIP 100MML (ENDOMECHANICALS) ×2 IMPLANT
TROCAR XCEL NON-BLD 11X100MML (ENDOMECHANICALS) IMPLANT

## 2020-02-22 NOTE — Discharge Instructions (Signed)
[facility] ______[facility] °LAPAROSCOPIC SURGERY: POST OP INSTRUCTIONS °Always review your discharge instruction sheet given to you by the facility where your surgery was performed. °IF YOU HAVE DISABILITY OR FAMILY LEAVE FORMS, YOU MUST BRING THEM TO THE OFFICE FOR PROCESSING.   °DO NOT GIVE THEM TO YOUR DOCTOR. ° °1. A prescription for pain medication may be given to you upon discharge.  Take your pain medication as prescribed, if needed.  If narcotic pain medicine is not needed, then you may take acetaminophen (Tylenol) or ibuprofen (Advil) as needed. °2. Take your usually prescribed medications unless otherwise directed. °3. If you need a refill on your pain medication, please contact your pharmacy.  They will contact our office to request authorization. Prescriptions will not be filled after 5pm or on week-ends. °4. You should follow a light diet the first few days after arrival home, such as soup and crackers, etc.  Be sure to include lots of fluids daily. °5. Most patients will experience some swelling and bruising in the area of the incisions.  Ice packs will help.  Swelling and bruising can take several days to resolve.  °6. It is common to experience some constipation if taking pain medication after surgery.  Increasing fluid intake and taking a stool softener (such as Colace) will usually help or prevent this problem from occurring.  A mild laxative (Milk of Magnesia or Miralax) should be taken according to package instructions if there are no bowel movements after 48 hours. °7. Unless discharge instructions indicate otherwise, you may remove your bandages 24-48 hours after surgery, and you may shower at that time.  You may have steri-strips (small skin tapes) in place directly over the incision.  These strips should be left on the skin for 7-10 days.  If your surgeon used skin glue on the incision, you may shower in 24 hours.  The glue will flake off over the next 2-3 weeks.  Any sutures or  staples will be removed at the office during your follow-up visit. °8. ACTIVITIES:  You may resume regular (light) daily activities beginning the next day--such as daily self-care, walking, climbing stairs--gradually increasing activities as tolerated.  You may have sexual intercourse when it is comfortable.  Refrain from any heavy lifting or straining until approved by your doctor. °a. You may drive when you are no longer taking prescription pain medication, you can comfortably wear a seatbelt, and you can safely maneuver your car and apply brakes. °b. RETURN TO WORK:  __________________________________________________________ °9. You should see your doctor in the office for a follow-up appointment approximately 2-3 weeks after your surgery.  Make sure that you call for this appointment within a day or two after you arrive home to insure a convenient appointment time. °10. OTHER INSTRUCTIONS: __________________________________________________________________________________________________________________________ __________________________________________________________________________________________________________________________ °WHEN TO CALL YOUR DOCTOR: °1. Fever over 101.0 °2. Inability to urinate °3. Continued bleeding from incision. °4. Increased pain, redness, or drainage from the incision. °5. Increasing abdominal pain ° °The clinic staff is available to answer your questions during regular business hours.  Please don’t hesitate to call and ask to speak to one of the nurses for clinical concerns.  If you have a medical emergency, go to the nearest emergency room or call 911.  A surgeon from [facility] is always on call at the hospital. °[address], [address], [place], [place]  [address] ? [address] [address], [place], [place]   [address] °[phone] ? [phone] ? FAX [phone] °Web site:   [email or web address] °

## 2020-02-22 NOTE — Transfer of Care (Signed)
Immediate Anesthesia Transfer of Care Note  Patient: Garrett Cunningham  Procedure(s) Performed: LAPAROSCOPIC CHOLECYSTECTOMY (N/A )  Patient Location: PACU  Anesthesia Type:General  Level of Consciousness: sedated, patient cooperative and responds to stimulation  Airway & Oxygen Therapy: Patient Spontanous Breathing and Patient connected to face mask oxygen  Post-op Assessment: Report given to RN and Post -op Vital signs reviewed and stable  Post vital signs: Reviewed and stable  Last Vitals:  Vitals Value Taken Time  BP    Temp    Pulse 85 02/22/20 1132  Resp 21 02/22/20 1132  SpO2 100 % 02/22/20 1132  Vitals shown include unvalidated device data.  Last Pain:  Vitals:   02/22/20 0830  TempSrc:   PainSc: 3          Complications: No apparent anesthesia complications

## 2020-02-22 NOTE — Anesthesia Postprocedure Evaluation (Signed)
Anesthesia Post Note  Patient: Garrett Cunningham  Procedure(s) Performed: LAPAROSCOPIC CHOLECYSTECTOMY (N/A )     Patient location during evaluation: PACU Anesthesia Type: General Level of consciousness: awake and alert Pain management: pain level controlled Vital Signs Assessment: post-procedure vital signs reviewed and stable Respiratory status: spontaneous breathing, nonlabored ventilation and respiratory function stable Cardiovascular status: blood pressure returned to baseline and stable Postop Assessment: no apparent nausea or vomiting Anesthetic complications: no    Last Vitals:  Vitals:   02/22/20 1215 02/22/20 1300  BP: 138/90 (!) 119/98  Pulse: 71   Resp: 16 15  Temp: 36.6 C 36.5 C  SpO2: 96% 100%    Last Pain:  Vitals:   02/22/20 1300  TempSrc:   PainSc: 2                  Beryle Lathe

## 2020-02-22 NOTE — Anesthesia Preprocedure Evaluation (Addendum)
Anesthesia Evaluation  Patient identified by MRN, date of birth, ID band Patient awake    Reviewed: Allergy & Precautions, NPO status , Patient's Chart, lab work & pertinent test results  History of Anesthesia Complications Negative for: history of anesthetic complications  Airway Mallampati: II  TM Distance: >3 FB Neck ROM: Full    Dental  (+) Dental Advisory Given, Teeth Intact   Pulmonary Current Smoker and Patient abstained from smoking.,    Pulmonary exam normal        Cardiovascular negative cardio ROS Normal cardiovascular exam     Neuro/Psych negative neurological ROS  negative psych ROS   GI/Hepatic negative GI ROS, (+)     substance abuse  marijuana use,   Endo/Other  negative endocrine ROS  Renal/GU negative Renal ROS     Musculoskeletal negative musculoskeletal ROS (+)   Abdominal   Peds  Hematology negative hematology ROS (+)   Anesthesia Other Findings Covid neg 4/30  Reproductive/Obstetrics                            Anesthesia Physical Anesthesia Plan  ASA: II  Anesthesia Plan: General   Post-op Pain Management:    Induction: Intravenous  PONV Risk Score and Plan: 2 and Treatment may vary due to age or medical condition, Ondansetron, Dexamethasone and Midazolam  Airway Management Planned: Oral ETT  Additional Equipment: None  Intra-op Plan:   Post-operative Plan: Extubation in OR  Informed Consent: I have reviewed the patients History and Physical, chart, labs and discussed the procedure including the risks, benefits and alternatives for the proposed anesthesia with the patient or authorized representative who has indicated his/her understanding and acceptance.     Dental advisory given  Plan Discussed with: CRNA and Anesthesiologist  Anesthesia Plan Comments:        Anesthesia Quick Evaluation

## 2020-02-22 NOTE — Interval H&P Note (Signed)
History and Physical Interval Note:  02/22/2020 9:46 AM  Garrett Cunningham  has presented today for surgery, with the diagnosis of EPIGASTRIC ABDOMINAL PAIN.  The various methods of treatment have been discussed with the patient and family. After consideration of risks, benefits and other options for treatment, the patient has consented to  Procedure(s): LAPAROSCOPIC CHOLECYSTECTOMY (N/A) as a surgical intervention.  The patient's history has been reviewed, patient examined, no change in status, stable for surgery.  I have reviewed the patient's chart and labs.  Questions were answered to the patient's satisfaction.     Flordia Kassem A Wane Mollett

## 2020-02-22 NOTE — Anesthesia Procedure Notes (Signed)
Procedure Name: Intubation Performed by: Aleeza Bellville J, CRNA Pre-anesthesia Checklist: Patient identified, Emergency Drugs available, Suction available, Patient being monitored and Timeout performed Patient Re-evaluated:Patient Re-evaluated prior to induction Oxygen Delivery Method: Circle system utilized Preoxygenation: Pre-oxygenation with 100% oxygen Induction Type: IV induction Ventilation: Mask ventilation without difficulty Laryngoscope Size: Mac and 4 Grade View: Grade I Tube type: Oral Tube size: 7.5 mm Number of attempts: 1 Airway Equipment and Method: Stylet Placement Confirmation: ETT inserted through vocal cords under direct vision,  positive ETCO2 and breath sounds checked- equal and bilateral Secured at: 23 cm Tube secured with: Tape Dental Injury: Teeth and Oropharynx as per pre-operative assessment        

## 2020-02-22 NOTE — Op Note (Signed)
Laparoscopic Cholecystectomy  Procedure Note  Indications: This patient presents  Abdominal pain for 3 years in abdomen right upper quadrant. Work up negative and and discussed this as being potential dyskinesia.  We discussed HIDA study but he had multiple studies and did not wish to undergo any further work-up.  His symptoms were consistent with colic symptoms and he had extensive GI work-up and imaging work-up of the last 3 years finding no identifiable cause.  I told him success rates for pain relief for about 50 to 75% with this procedure.  He opted to l undergo laparoscopic cholecystectomy. The procedure has been discussed with the patient. Operative and non operative treatments have been discussed. Risks of surgery include bleeding, infection,  Common bile duct injury,  Injury to the stomach,liver, colon,small intestine, abdominal wall,  Diaphragm,  Major blood vessels,  And the need for an open procedure.  Other risks include worsening of medical problems, death,  DVT and pulmonary embolism, and cardiovascular events.   Medical options have also been discussed. The patient has been informed of long term expectations of surgery and non surgical options,  The patient agrees to proceed.    Pre-operative Diagnosis: Abdominal pain, right upper quadrant  Post-operative Diagnosis: Same  Surgeon: Dortha Schwalbe  MD   Assistants: none   Anesthesia: General endotracheal anesthesia and Local anesthesia 0.25.% bupivacaine  ASA Class: 1  Procedure Details  The patient was seen again in the Holding Room. The risks, benefits, complications, treatment options, and expected outcomes were discussed with the patient. The possibilities of reaction to medication, pulmonary aspiration, perforation of viscus, bleeding, recurrent infection, finding a normal gallbladder, the need for additional procedures, failure to diagnose a condition, the possible need to convert to an open procedure, and creating a  complication requiring transfusion or operation were discussed with the patient. The patient and/or family concurred with the proposed plan, giving informed consent. The site of surgery properly noted/marked. The patient was taken to Operating Room, identified as Garrett Cunningham and the procedure verified as Laparoscopic Cholecystectomy with Intraoperative Cholangiograms. A Time Out was held and the above information confirmed.  Prior to the induction of general anesthesia, antibiotic prophylaxis was administered. General endotracheal anesthesia was then administered and tolerated well. After the induction, the abdomen was prepped in the usual sterile fashion. The patient was positioned in the supine position with the left arm comfortably tucked, along with some reverse Trendelenburg.  Local anesthetic agent was injected into the skin near the umbilicus and an incision made. The midline fascia was incised and the Hasson technique was used to introduce a 12 mm port under direct vision. It was secured with a figure of eight Vicryl suture placed in the usual fashion. Pneumoperitoneum was then created with CO2 and tolerated well without any adverse changes in the patient's vital signs. Additional trocars were introduced under direct vision with an 11 mm trocar in the epigastrium and 2 5 mm trocars in the right upper quadrant. All skin incisions were infiltrated with a local anesthetic agent before making the incision and placing the trocars.   The gallbladder was identified, the fundus grasped and retracted cephalad. Adhesions were lysed bluntly and with the electrocautery where indicated, taking care not to injure any adjacent organs or viscus. The infundibulum was grasped and retracted laterally, exposing the peritoneum overlying the triangle of Calot. This was then divided and exposed in a blunt fashion. The cystic duct was clearly identified and bluntly dissected circumferentially. The junctions of  the  gallbladder, cystic duct and common bile duct were clearly identified prior to the division of any linear structure.   Critical view obtained.  Cystic duct was very small and cholangiogram not performed.   The cystic duct was then  ligated with surgical clips  on the patient side and  clipped on the gallbladder side and divided. The cystic artery was identified, dissected free, ligated with clips and divided as well. Posterior cystic artery clipped and divided.  The gallbladder was dissected from the liver bed in retrograde fashion with the electrocautery. The gallbladder was removed. The liver bed was irrigated and inspected. Hemostasis was achieved with the electrocautery. Copious irrigation was utilized and was repeatedly aspirated until clear all particulate matter. Hemostasis was achieved with no signs of bleeding or bile leakage.  Pneumoperitoneum was completely reduced after viewing removal of the trocars under direct vision. The wound was thoroughly irrigated and the fascia was then closed with a figure of eight suture; the skin was then closed with 4 O monocryl  and a sterile dressing was applied.  Instrument, sponge, and needle counts were correct at closure and at the conclusion of the case.   Findings: Mild cholecystitis   Estimated Blood Loss: Minimal         Drains: none         Total IV Fluids: per record         Specimens: Gallbladder           Complications: None; patient tolerated the procedure well.         Disposition: PACU - hemodynamically stable.         Condition: stable

## 2020-02-23 LAB — SURGICAL PATHOLOGY

## 2020-06-30 IMAGING — MR MR THORACIC SPINE W/O CM
4 of 7 series · 20 of 48 positions shown · non-contrast
Comparison: Chest radiographs 01/25/2019. Lumbar MRI 03/18/2018.

CLINICAL DATA: 33-year-old male with back and chest pain for about
2 years with no known injury.

EXAM:
MRI THORACIC SPINE WITHOUT CONTRAST
TECHNIQUE: Multiplanar, multisequence MR imaging of the thoracic spine was
performed. No intravenous contrast was administered.

[Series 17: T1 · sagittal · 3.0mm · 0.94mm/px · 3 of 15 slices shown]
[im 1/15]
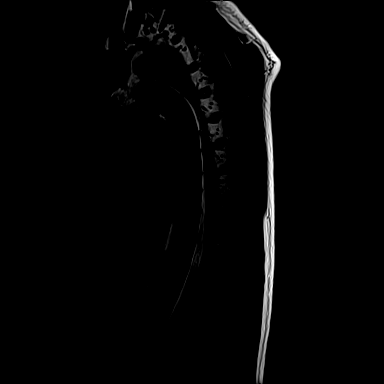
[im 8/15]
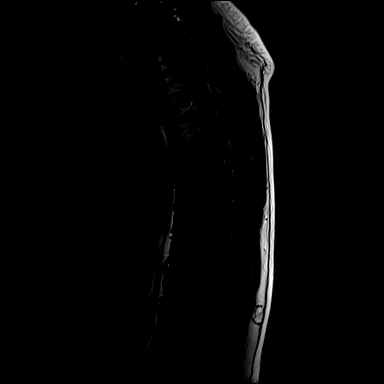
[im 15/15]
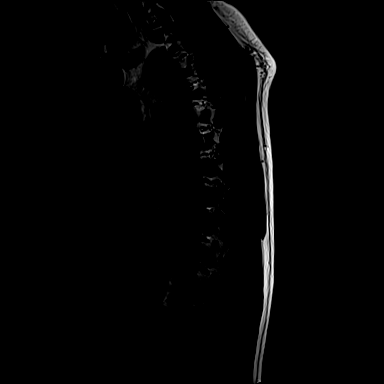

[Series 18: STIR · sagittal · 3.0mm · 1.00mm/px · 3 of 15 slices shown]
[im 1/15]
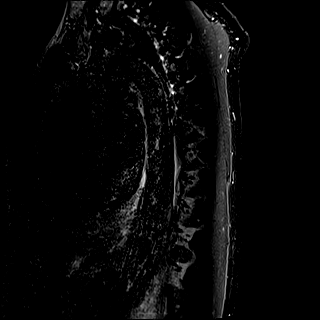
[im 8/15]
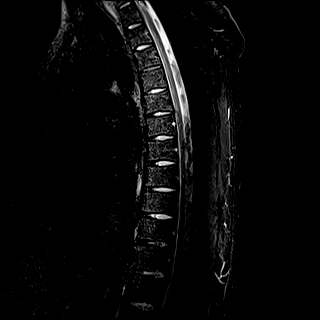
[im 15/15]
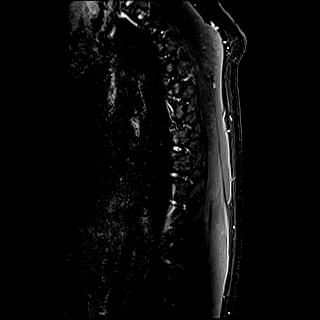

[Series 19: T2 · sagittal · 3.0mm · 0.83mm/px · 5 of 15 slices shown (1 of 2)]
[im 1/15]
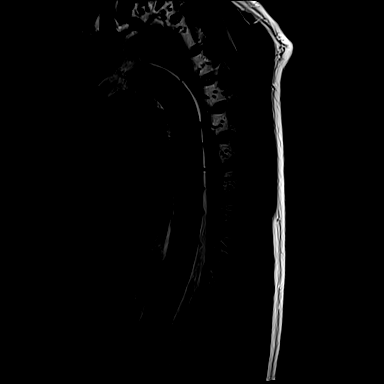
[im 4/15]
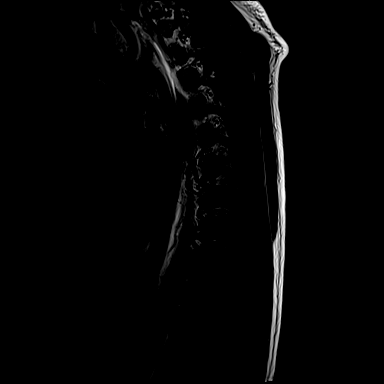
[im 8/15]
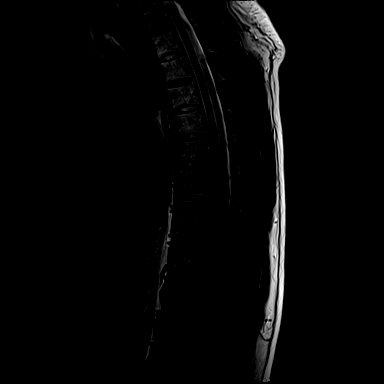
[im 11/15]
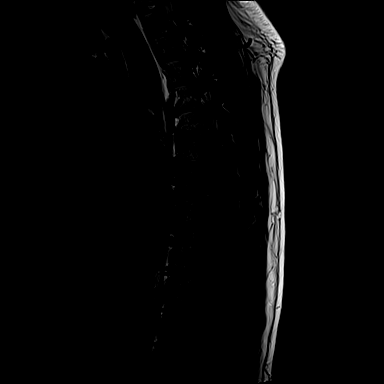
[im 15/15]
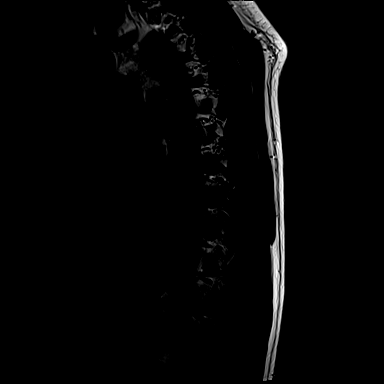

[Series 20: T2 · axial · 4.0mm · 0.28mm/px · z∈[-350,-95]mm · 9 of 39 slices shown (2 of 2)]
[im 1/39]
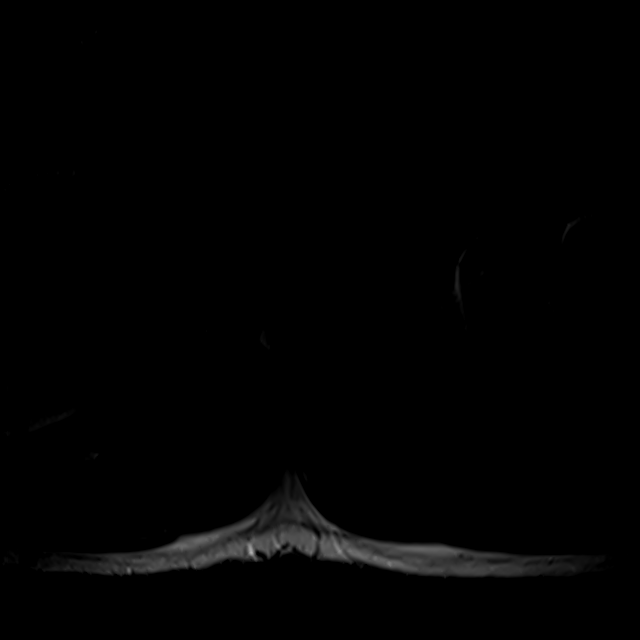
[im 7/39]
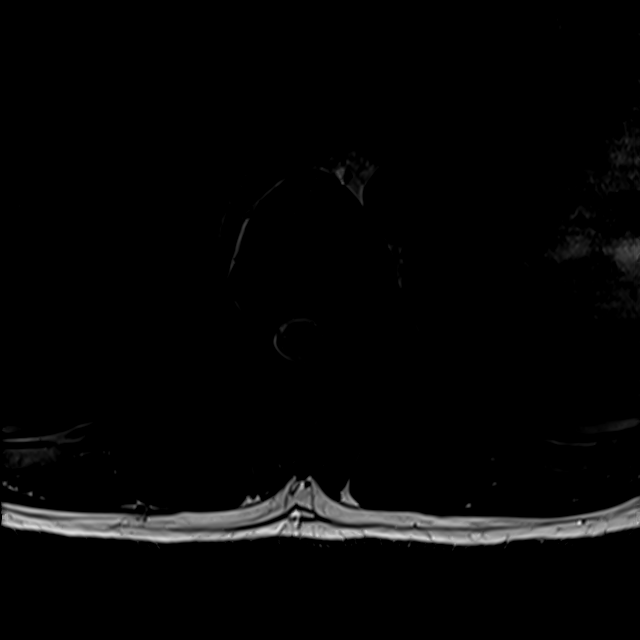
[im 11/39]
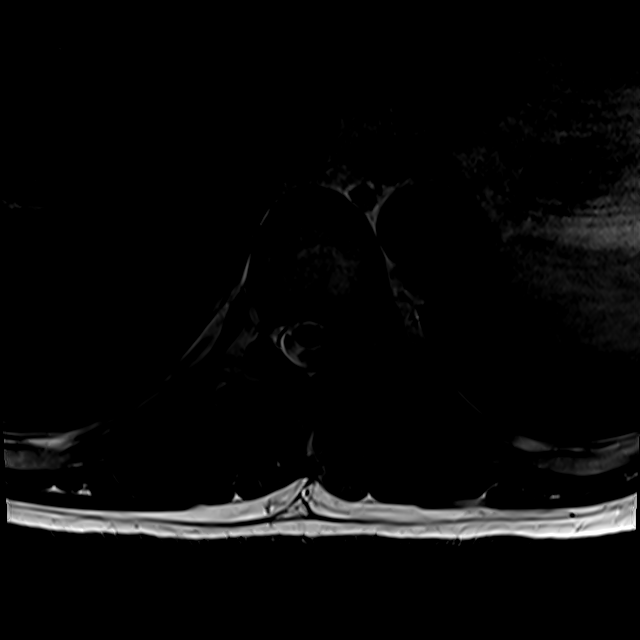
[im 18/39]
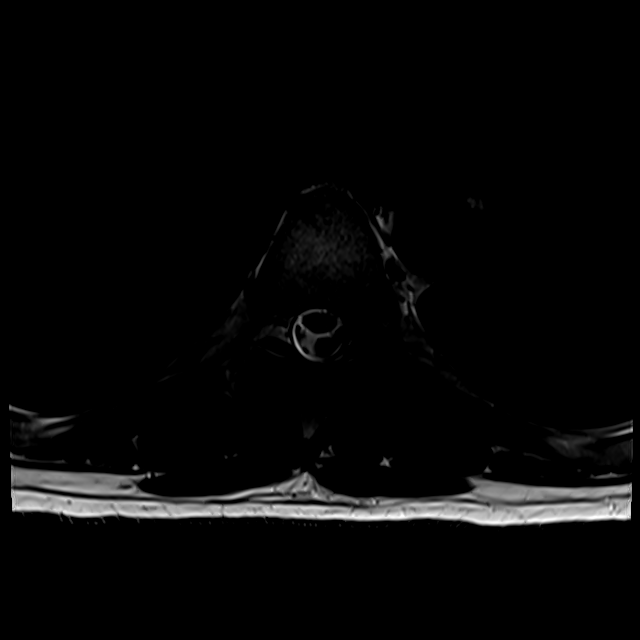
[im 21/39]
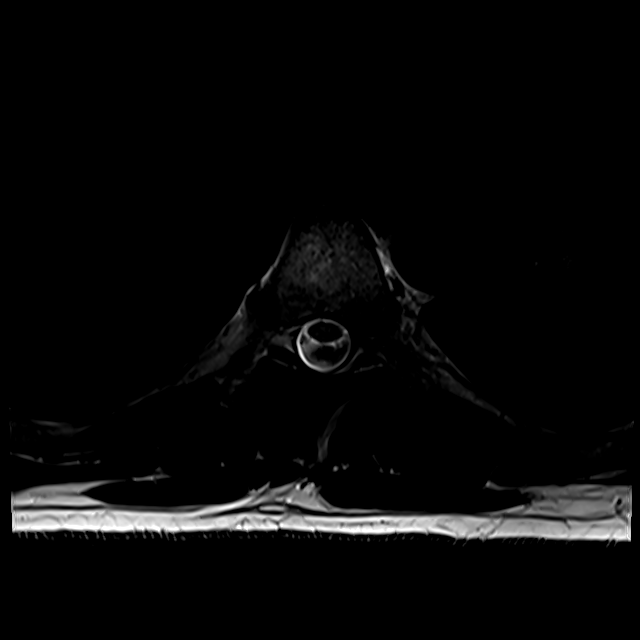
[im 28/39]
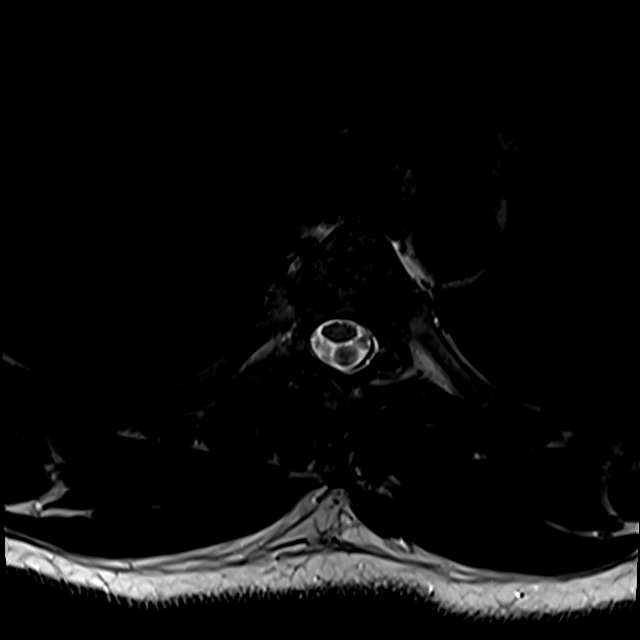
[im 32/39]
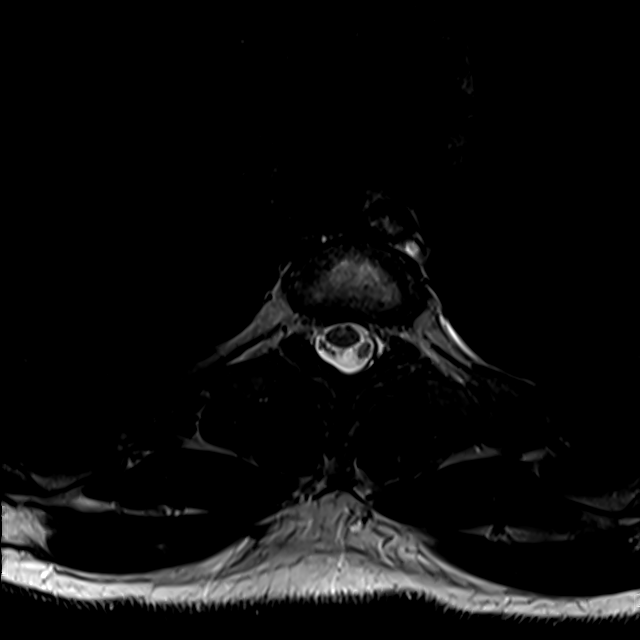
[im 35/39]
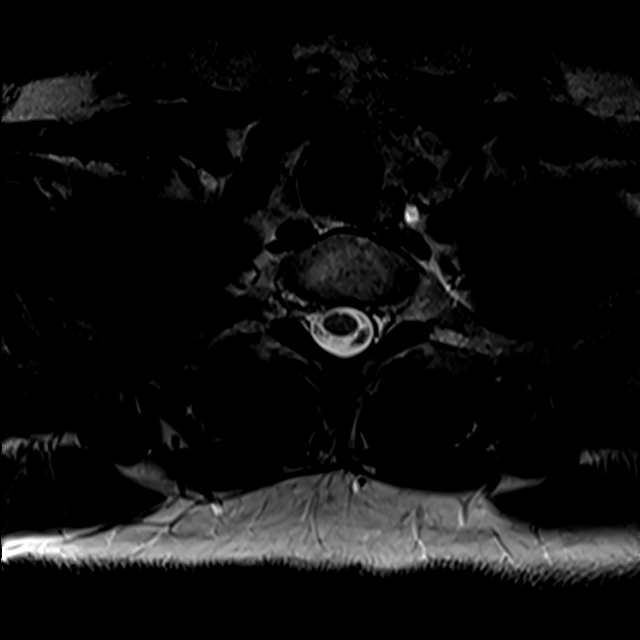
[im 39/39]
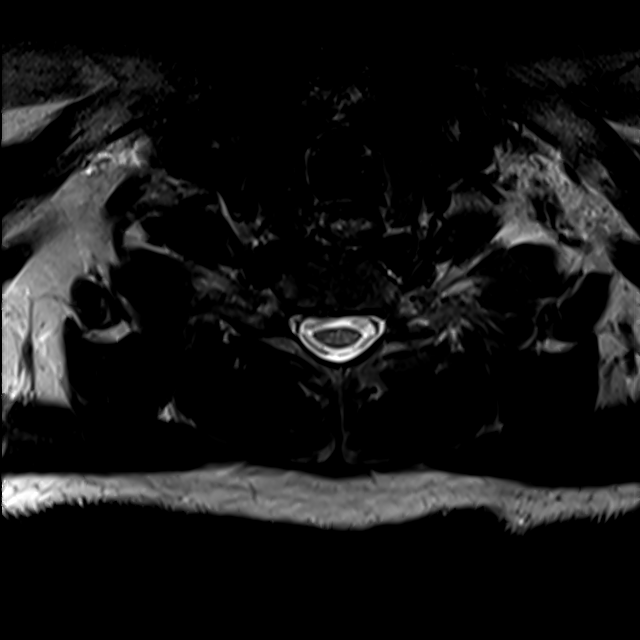

[20 of 48 positions shown; findings below may reference images not displayed]

FINDINGS: Limited cervical spine imaging:  Grossly negative.

Thoracic spine segmentation: Appears to be normal, and this
numbering system appears concordant with the lumbar MRI in 4019.

Alignment:  Preserved thoracic kyphosis. No spondylolisthesis.

Vertebrae: Visualized bone marrow signal is within normal limits. No
marrow edema or evidence of acute osseous abnormality.

Cord: Spinal cord signal is within normal limits at all visualized
levels. Capacious thoracic spinal canal. The conus medullaris is
mostly visible at T12-L1, appears stable since 4019 and negative.

Paraspinal and other soft tissues: Negative visible thoracic and
upper abdominal viscera. Normal visible paraspinal soft tissues.

Disc levels:

T1-T2: Negative.

T2-T3: Negative.

T3-T4: Negative.

T4-T5: Negative.

T5-T6: Subtle left paracentral disc bulge or protrusion (series 12,
image 17 and series 19, image 8. This effaces the ventral CSF space,
but the thecal sac is capacious with no spinal stenosis. No
foraminal involvement.

T6-T7: Negative.

T7-T8: Negative.

T8-T9: Negative.

T9-T10: Negative.

T10-T11: Mild disc desiccation and disc space loss. But no
convincing disc bulge or herniation. No stenosis.

T11-T12: Negative disc. Mild right facet hypertrophy without
stenosis.

T12-L1: Mostly visible, stable since 4019 and negative.
IMPRESSION: 1. Subtle left paracentral disc herniation at T5-T6 without spinal
stenosis or convincing neural impingement. Minimal disc degeneration
at T10-T11.
2. Otherwise normal thoracic spine. Capacious thoracic spinal canal
with normal thoracic spinal cord. No osseous abnormality identified.

## 2020-07-12 DIAGNOSIS — H5213 Myopia, bilateral: Secondary | ICD-10-CM | POA: Diagnosis not present

## 2020-08-30 ENCOUNTER — Ambulatory Visit: Payer: BC Managed Care – PPO | Admitting: Family Medicine

## 2020-09-01 ENCOUNTER — Ambulatory Visit: Payer: BC Managed Care – PPO | Admitting: Family Medicine

## 2020-09-07 ENCOUNTER — Encounter: Payer: Self-pay | Admitting: Family Medicine

## 2020-09-07 ENCOUNTER — Other Ambulatory Visit: Payer: Self-pay

## 2020-09-07 ENCOUNTER — Ambulatory Visit (INDEPENDENT_AMBULATORY_CARE_PROVIDER_SITE_OTHER): Payer: BC Managed Care – PPO | Admitting: Family Medicine

## 2020-09-07 DIAGNOSIS — R0789 Other chest pain: Secondary | ICD-10-CM | POA: Diagnosis not present

## 2020-09-07 DIAGNOSIS — E559 Vitamin D deficiency, unspecified: Secondary | ICD-10-CM | POA: Diagnosis not present

## 2020-09-07 NOTE — Progress Notes (Signed)
Office Visit Note   Patient: Garrett Cunningham           Date of Birth: 1985/11/06           MRN: [id number] Visit Date: 09/07/2020 Requested by: No referring provider defined for this encounter. PCP: No primary care provider on file.  Subjective: Chief Complaint  Patient presents with  . continues to have pain shooting from spine to chest  . soreness in the right arm  . left flank pain    HPI: He is here for follow-up chronic thoracic and chest pain.  Since last visit he underwent cholecystectomy which improved his right upper quadrant pain but did not get rid of his chronic pain.  He has tried physical therapy with dry needling which gave temporary improvement.  He has had a scapulothoracic injection per Dr. Alvester Morin which really did not help.  He went for an MRI scan which did not reveal any nerve compression to explain his pain.  He is not taking anything for his pain.  He is not taking anything other than a multivitamin.           ROS:   Energy level is decent overall.  No fevers or chills.  All other systems were reviewed and are negative.  Objective: Vital Signs: There were no vitals taken for this visit.  Physical Exam:  General:  Alert and oriented, in no acute distress. Pulm:  Breathing unlabored. Psy:  Normal mood, congruent affect. Skin: No rash Back: He has a tender trigger point to the right of midline in the mid thoracic area, palpation of this seems to reproduce most of his pain.  Imaging: No results found.  Assessment & Plan: 1.  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  Cannot rule out vitamin D deficiency. -Discussed options with him and elected to inject the trigger point today with 3 cc 1% lidocaine without epinephrine and 2 cc 50% dextrose.  We will check vitamin D level as well, and treat accordingly.  2.  History of abnormal Lyme disease Western blot testing -If he fails to improve with injection, and vitamin D level is normal, consider a trial of  treatment for this.     Procedures: Trigger point injected with 3 cc 1% lidocaine without epinephrine and 2 cc 50% dextrose.  Not a lot of immediate improvement.    PMFS History: Patient Active Problem List   Diagnosis Date Noted  . Diarrhea 08/09/2019  . Chest wall pain 09/14/2018  . Cough 09/14/2018  . Nicotine dependence, cigarettes, uncomplicated 09/14/2018  . Viral upper respiratory tract infection 07/24/2018  . Epigastric pain 06/04/2018  . Chronic pain syndrome 05/11/2018   Past Medical History:  Diagnosis Date  . Myalgia     Family History  Problem Relation Age of Onset  . Lupus Mother   . Other Sister        RSD  . Other Father        hx unknown   . Colon cancer Neg Hx   . Esophageal cancer Neg Hx   . Rectal cancer Neg Hx     Past Surgical History:  Procedure Laterality Date  . CHOLECYSTECTOMY N/A 02/22/2020   Procedure: LAPAROSCOPIC CHOLECYSTECTOMY;  Surgeon: Harriette Bouillon, MD;  Location: [facility];  Service: General;  Laterality: N/A;  . HERNIA REPAIR Left 2018   Social History   Occupational History  . Occupation: [employer]    Comment: [employer]  Tobacco Use  . Smoking status: Current Every  Day Smoker    Packs/day: 1.00    Years: 13.00    Pack years: 13.00    Types: Cigarettes  . Smokeless tobacco: Never Used  Vaping Use  . Vaping Use: Never used  Substance and Sexual Activity  . Alcohol use: Not Currently    Comment: rarely, quit 03/2018  . Drug use: Yes    Types: Marijuana    Comment: daily  . Sexual activity: Yes

## 2020-09-08 ENCOUNTER — Telehealth: Payer: Self-pay | Admitting: Family Medicine

## 2020-09-08 LAB — VITAMIN D 25 HYDROXY (VIT D DEFICIENCY, FRACTURES): Vit D, 25-Hydroxy: 37 ng/mL (ref 30–100)

## 2020-09-08 NOTE — Telephone Encounter (Signed)
Vitamin D looks adequate at 37 (ideal is 50-80).  Could consider an additional vitamin D3 at 2,000 IU daily long-term.  Vitamin D deficiency is not the likely cause of your pain.

## 2020-09-20 MED ORDER — VARENICLINE TARTRATE 0.5 MG PO TABS: 0.5000 mg | ORAL_TABLET | Freq: Two times a day (BID) | ORAL | 0 refills | Status: AC

## 2020-09-20 MED ORDER — DOXYCYCLINE HYCLATE 100 MG PO CAPS: ORAL_CAPSULE | ORAL | 3 refills | Status: AC

## 2020-09-20 MED ORDER — VARENICLINE TARTRATE 1 MG PO TABS: 1.0000 mg | ORAL_TABLET | Freq: Two times a day (BID) | ORAL | 6 refills | Status: AC

## 2020-09-20 NOTE — Addendum Note (Signed)
Addended by: Lillia Carmel on: 09/20/2020 08:17 AM   Modules accepted: Orders

## 2020-09-20 NOTE — Addendum Note (Signed)
Addended by: Lillia Carmel on: 09/20/2020 11:01 AM   Modules accepted: Orders

## 2020-10-11 ENCOUNTER — Encounter: Payer: Self-pay | Admitting: Family Medicine

## 2020-10-28 DIAGNOSIS — Z1152 Encounter for screening for COVID-19: Secondary | ICD-10-CM | POA: Diagnosis not present

## 2021-01-05 ENCOUNTER — Encounter: Payer: Self-pay | Admitting: Family Medicine

## 2021-01-05 MED ORDER — DULOXETINE HCL 20 MG PO CPEP: 20.0000 mg | ORAL_CAPSULE | Freq: Every day | ORAL | 3 refills | Status: DC

## 2021-01-05 NOTE — Addendum Note (Signed)
Addended by: Lillia Carmel on: 01/05/2021 04:27 PM   Modules accepted: Orders

## 2021-03-20 ENCOUNTER — Encounter: Payer: Self-pay | Admitting: Family Medicine

## 2021-03-20 MED ORDER — DULOXETINE HCL 30 MG PO CPEP: 30.0000 mg | ORAL_CAPSULE | Freq: Every day | ORAL | 6 refills | Status: AC
